# Patient Record
Sex: Female | Born: 1945 | Race: White | Hispanic: No | Marital: Married | Smoking: Never smoker
Health system: Southern US, Community
[De-identification: ages and names within clinical notes are randomized; demographics above are authoritative.]

## PROBLEM LIST (undated history)

## (undated) DIAGNOSIS — E785 Hyperlipidemia, unspecified: Secondary | ICD-10-CM

## (undated) DIAGNOSIS — M1990 Unspecified osteoarthritis, unspecified site: Secondary | ICD-10-CM

## (undated) HISTORY — DX: Unspecified osteoarthritis, unspecified site: M19.90

## (undated) HISTORY — PX: BIOPSY THYROID: PRO38

## (undated) HISTORY — DX: Hyperlipidemia, unspecified: E78.5

## (undated) HISTORY — PX: BREAST BIOPSY: SHX20

## (undated) HISTORY — PX: APPENDECTOMY: SHX54

## (undated) HISTORY — PX: CHOLECYSTECTOMY: SHX55

## (undated) HISTORY — PX: BREAST CYST ASPIRATION: SHX578

---

## 1984-05-09 HISTORY — PX: ABDOMINAL HYSTERECTOMY: SHX81

## 2008-05-09 HISTORY — PX: EYE SURGERY: SHX253

## 2011-04-20 DIAGNOSIS — M19039 Primary osteoarthritis, unspecified wrist: Secondary | ICD-10-CM | POA: Insufficient documentation

## 2014-05-09 HISTORY — PX: OTHER SURGICAL HISTORY: SHX169

## 2014-07-24 LAB — HM COLONOSCOPY

## 2015-05-20 DIAGNOSIS — R030 Elevated blood-pressure reading, without diagnosis of hypertension: Secondary | ICD-10-CM | POA: Diagnosis not present

## 2015-05-20 DIAGNOSIS — E785 Hyperlipidemia, unspecified: Secondary | ICD-10-CM | POA: Diagnosis not present

## 2015-05-22 DIAGNOSIS — M1711 Unilateral primary osteoarthritis, right knee: Secondary | ICD-10-CM | POA: Diagnosis not present

## 2015-05-22 DIAGNOSIS — M899 Disorder of bone, unspecified: Secondary | ICD-10-CM | POA: Diagnosis not present

## 2015-05-22 DIAGNOSIS — I10 Essential (primary) hypertension: Secondary | ICD-10-CM | POA: Diagnosis not present

## 2015-05-22 DIAGNOSIS — R030 Elevated blood-pressure reading, without diagnosis of hypertension: Secondary | ICD-10-CM | POA: Diagnosis not present

## 2015-05-22 DIAGNOSIS — E785 Hyperlipidemia, unspecified: Secondary | ICD-10-CM | POA: Diagnosis not present

## 2015-06-26 DIAGNOSIS — R252 Cramp and spasm: Secondary | ICD-10-CM | POA: Diagnosis not present

## 2015-08-13 DIAGNOSIS — R252 Cramp and spasm: Secondary | ICD-10-CM | POA: Diagnosis not present

## 2015-08-13 DIAGNOSIS — R112 Nausea with vomiting, unspecified: Secondary | ICD-10-CM | POA: Diagnosis not present

## 2015-08-13 DIAGNOSIS — E860 Dehydration: Secondary | ICD-10-CM | POA: Diagnosis not present

## 2015-08-13 DIAGNOSIS — K529 Noninfective gastroenteritis and colitis, unspecified: Secondary | ICD-10-CM | POA: Diagnosis not present

## 2015-09-14 DIAGNOSIS — K432 Incisional hernia without obstruction or gangrene: Secondary | ICD-10-CM | POA: Diagnosis not present

## 2015-09-14 DIAGNOSIS — Z9889 Other specified postprocedural states: Secondary | ICD-10-CM | POA: Diagnosis not present

## 2015-10-26 DIAGNOSIS — N390 Urinary tract infection, site not specified: Secondary | ICD-10-CM | POA: Diagnosis not present

## 2015-10-26 DIAGNOSIS — R300 Dysuria: Secondary | ICD-10-CM | POA: Diagnosis not present

## 2015-11-03 DIAGNOSIS — E785 Hyperlipidemia, unspecified: Secondary | ICD-10-CM | POA: Diagnosis not present

## 2015-11-03 DIAGNOSIS — R030 Elevated blood-pressure reading, without diagnosis of hypertension: Secondary | ICD-10-CM | POA: Diagnosis not present

## 2015-11-07 HISTORY — PX: HERNIA REPAIR: SHX51

## 2015-11-13 DIAGNOSIS — R252 Cramp and spasm: Secondary | ICD-10-CM | POA: Diagnosis not present

## 2015-11-13 DIAGNOSIS — E042 Nontoxic multinodular goiter: Secondary | ICD-10-CM | POA: Diagnosis not present

## 2015-11-13 DIAGNOSIS — Z01811 Encounter for preprocedural respiratory examination: Secondary | ICD-10-CM | POA: Diagnosis not present

## 2015-11-13 DIAGNOSIS — R030 Elevated blood-pressure reading, without diagnosis of hypertension: Secondary | ICD-10-CM | POA: Diagnosis not present

## 2015-11-13 DIAGNOSIS — K432 Incisional hernia without obstruction or gangrene: Secondary | ICD-10-CM | POA: Diagnosis not present

## 2015-11-13 DIAGNOSIS — M899 Disorder of bone, unspecified: Secondary | ICD-10-CM | POA: Diagnosis not present

## 2015-11-13 DIAGNOSIS — E785 Hyperlipidemia, unspecified: Secondary | ICD-10-CM | POA: Diagnosis not present

## 2015-11-13 DIAGNOSIS — M8580 Other specified disorders of bone density and structure, unspecified site: Secondary | ICD-10-CM | POA: Diagnosis not present

## 2015-11-13 DIAGNOSIS — Z01812 Encounter for preprocedural laboratory examination: Secondary | ICD-10-CM | POA: Diagnosis not present

## 2015-12-14 DIAGNOSIS — Z01812 Encounter for preprocedural laboratory examination: Secondary | ICD-10-CM | POA: Diagnosis not present

## 2015-12-14 DIAGNOSIS — Z8719 Personal history of other diseases of the digestive system: Secondary | ICD-10-CM | POA: Diagnosis not present

## 2015-12-14 DIAGNOSIS — Z9889 Other specified postprocedural states: Secondary | ICD-10-CM | POA: Diagnosis not present

## 2015-12-14 DIAGNOSIS — K432 Incisional hernia without obstruction or gangrene: Secondary | ICD-10-CM | POA: Diagnosis not present

## 2015-12-17 DIAGNOSIS — K432 Incisional hernia without obstruction or gangrene: Secondary | ICD-10-CM | POA: Diagnosis not present

## 2015-12-17 DIAGNOSIS — K5792 Diverticulitis of intestine, part unspecified, without perforation or abscess without bleeding: Secondary | ICD-10-CM | POA: Diagnosis not present

## 2015-12-17 DIAGNOSIS — Z5331 Laparoscopic surgical procedure converted to open procedure: Secondary | ICD-10-CM | POA: Diagnosis not present

## 2015-12-17 DIAGNOSIS — E785 Hyperlipidemia, unspecified: Secondary | ICD-10-CM | POA: Diagnosis not present

## 2015-12-18 DIAGNOSIS — E785 Hyperlipidemia, unspecified: Secondary | ICD-10-CM | POA: Diagnosis present

## 2015-12-18 DIAGNOSIS — K219 Gastro-esophageal reflux disease without esophagitis: Secondary | ICD-10-CM | POA: Diagnosis present

## 2015-12-18 DIAGNOSIS — K432 Incisional hernia without obstruction or gangrene: Secondary | ICD-10-CM | POA: Diagnosis present

## 2015-12-18 DIAGNOSIS — Z5331 Laparoscopic surgical procedure converted to open procedure: Secondary | ICD-10-CM | POA: Diagnosis not present

## 2015-12-18 DIAGNOSIS — M8580 Other specified disorders of bone density and structure, unspecified site: Secondary | ICD-10-CM | POA: Diagnosis present

## 2015-12-18 DIAGNOSIS — M1990 Unspecified osteoarthritis, unspecified site: Secondary | ICD-10-CM | POA: Diagnosis present

## 2015-12-18 DIAGNOSIS — K429 Umbilical hernia without obstruction or gangrene: Secondary | ICD-10-CM | POA: Diagnosis present

## 2015-12-18 DIAGNOSIS — K660 Peritoneal adhesions (postprocedural) (postinfection): Secondary | ICD-10-CM | POA: Diagnosis present

## 2015-12-18 DIAGNOSIS — E559 Vitamin D deficiency, unspecified: Secondary | ICD-10-CM | POA: Diagnosis present

## 2015-12-18 DIAGNOSIS — E042 Nontoxic multinodular goiter: Secondary | ICD-10-CM | POA: Diagnosis present

## 2015-12-18 DIAGNOSIS — G8918 Other acute postprocedural pain: Secondary | ICD-10-CM | POA: Diagnosis present

## 2016-02-01 DIAGNOSIS — H35373 Puckering of macula, bilateral: Secondary | ICD-10-CM | POA: Diagnosis not present

## 2016-02-01 DIAGNOSIS — D3132 Benign neoplasm of left choroid: Secondary | ICD-10-CM | POA: Diagnosis not present

## 2016-02-01 DIAGNOSIS — Z961 Presence of intraocular lens: Secondary | ICD-10-CM | POA: Diagnosis not present

## 2016-02-10 DIAGNOSIS — L821 Other seborrheic keratosis: Secondary | ICD-10-CM | POA: Diagnosis not present

## 2016-02-10 DIAGNOSIS — L309 Dermatitis, unspecified: Secondary | ICD-10-CM | POA: Diagnosis not present

## 2016-02-10 DIAGNOSIS — D1801 Hemangioma of skin and subcutaneous tissue: Secondary | ICD-10-CM | POA: Diagnosis not present

## 2016-02-18 DIAGNOSIS — Z23 Encounter for immunization: Secondary | ICD-10-CM | POA: Diagnosis not present

## 2016-03-20 DIAGNOSIS — J029 Acute pharyngitis, unspecified: Secondary | ICD-10-CM | POA: Diagnosis not present

## 2016-03-20 DIAGNOSIS — K121 Other forms of stomatitis: Secondary | ICD-10-CM | POA: Diagnosis not present

## 2016-03-25 DIAGNOSIS — K120 Recurrent oral aphthae: Secondary | ICD-10-CM | POA: Diagnosis not present

## 2016-04-13 DIAGNOSIS — E042 Nontoxic multinodular goiter: Secondary | ICD-10-CM | POA: Diagnosis not present

## 2016-04-13 DIAGNOSIS — Z6822 Body mass index (BMI) 22.0-22.9, adult: Secondary | ICD-10-CM | POA: Diagnosis not present

## 2016-05-11 ENCOUNTER — Ambulatory Visit: Payer: Self-pay | Admitting: Physician Assistant

## 2016-05-23 ENCOUNTER — Ambulatory Visit (INDEPENDENT_AMBULATORY_CARE_PROVIDER_SITE_OTHER): Payer: Medicare Other | Admitting: Physician Assistant

## 2016-05-23 ENCOUNTER — Encounter: Payer: Self-pay | Admitting: Physician Assistant

## 2016-05-23 VITALS — BP 150/82 | HR 72 | Temp 98.0°F | Resp 16 | Ht 66.0 in | Wt 142.0 lb

## 2016-05-23 DIAGNOSIS — K5790 Diverticulosis of intestine, part unspecified, without perforation or abscess without bleeding: Secondary | ICD-10-CM | POA: Insufficient documentation

## 2016-05-23 DIAGNOSIS — M1990 Unspecified osteoarthritis, unspecified site: Secondary | ICD-10-CM | POA: Insufficient documentation

## 2016-05-23 DIAGNOSIS — K219 Gastro-esophageal reflux disease without esophagitis: Secondary | ICD-10-CM | POA: Insufficient documentation

## 2016-05-23 DIAGNOSIS — Z7689 Persons encountering health services in other specified circumstances: Secondary | ICD-10-CM | POA: Diagnosis not present

## 2016-05-23 DIAGNOSIS — M1711 Unilateral primary osteoarthritis, right knee: Secondary | ICD-10-CM

## 2016-05-23 MED ORDER — MELOXICAM 7.5 MG PO TABS: 7.5000 mg | ORAL_TABLET | Freq: Every day | ORAL | 0 refills | Status: DC

## 2016-05-23 NOTE — Progress Notes (Signed)
Patient: Megan Cervantes Female    DOB: 02-22-1946   71 y.o.   MRN: [id number] Visit Date: 05/23/2016  Today's Provider: Mar Daring, PA-C   Chief Complaint  Patient presents with  . New Patient (Initial Visit)   Subjective:    HPI Patient here today to establish care. Patient was been seen at [facility] in [place], [place]. Patient reports she is UTD on vaccines. Patient was a Psychologist, occupational at [employer] in [place] and she was required to have all vaccines UTD.  Patient reports she is having right knee pain worsening in the last 6 months. This is her only complaint. She has been using NSAIDs, heat, and ice. She has had imaging done in [place], [place] but I do not have these records currently. She has completed a medical release form.     Allergies  Allergen Reactions  . Penicillin G Rash     Current Outpatient Prescriptions:  .  Cholecalciferol (VITAMIN D) 2000 units CAPS, Take 1 capsule by mouth daily., Disp: , Rfl:  .  esomeprazole (NEXIUM) 40 MG capsule, Take 40 mg by mouth daily., Disp: , Rfl: 6 .  Misc Natural Products (GLUCOSAMINE CHOND COMPLEX/MSM PO), Take 1 tablet by mouth daily., Disp: , Rfl:  .  Multiple Vitamins-Minerals (CENTRUM SILVER PO), Take 1 tablet by mouth daily., Disp: , Rfl:  .  vitamin B-12 (CYANOCOBALAMIN) 1000 MCG tablet, Take 1,000 mcg by mouth daily., Disp: , Rfl:   Review of Systems  Constitutional: Negative.   HENT: Negative.   Eyes: Negative.   Respiratory: Negative.   Cardiovascular: Negative.   Gastrointestinal: Negative.   Endocrine: Negative.   Genitourinary: Negative.   Musculoskeletal: Positive for joint swelling (right knee).  Skin: Negative.   Allergic/Immunologic: Negative.   Neurological: Negative.   Hematological: Negative.   Psychiatric/Behavioral: Negative.     Social History  Substance Use Topics  . Smoking status: Never Smoker  . Smokeless tobacco: Never Used  . Alcohol use No   Objective:   BP (!)  150/82 (BP Location: Left Arm, Patient Position: Sitting, Cuff Size: Large)   Pulse 72   Temp 98 F (36.7 C) (Oral)   Resp 16   Ht 5\' 6"  (1.676 m)   Wt 142 lb (64.4 kg)   SpO2 99%   BMI 22.92 kg/m   Physical Exam  Constitutional: She is oriented to person, place, and time. She appears well-developed and well-nourished. No distress.  HENT:  Head: Normocephalic and atraumatic.  Right Ear: External ear normal.  Left Ear: External ear normal.  Nose: Nose normal.  Mouth/Throat: Oropharynx is clear and moist. No oropharyngeal exudate.  Eyes: Conjunctivae and EOM are normal. Pupils are equal, round, and reactive to light. Right eye exhibits no discharge. Left eye exhibits no discharge. No scleral icterus.  Neck: Normal range of motion. Neck supple. No JVD present. No tracheal deviation present. No thyromegaly present.  Cardiovascular: Normal rate, regular rhythm, normal heart sounds and intact distal pulses.  Exam reveals no gallop and no friction rub.   No murmur heard. Pulmonary/Chest: Effort normal and breath sounds normal. No respiratory distress. She has no wheezes. She has no rales. She exhibits no tenderness.  Abdominal: Soft. Bowel sounds are normal. She exhibits no distension and no mass. There is no tenderness. There is no rebound and no guarding.  Musculoskeletal: Normal range of motion. She exhibits no edema.       Right knee: She exhibits normal range of motion,  no swelling, no effusion, no erythema, normal patellar mobility, no bony tenderness, normal meniscus and no MCL laxity. Tenderness found. Medial joint line tenderness noted.  Lymphadenopathy:    She has no cervical adenopathy.  Neurological: She is alert and oriented to person, place, and time.  Skin: Skin is warm and dry. No rash noted. She is not diaphoretic.  Psychiatric: She has a normal mood and affect. Her behavior is normal. Judgment and thought content normal.  Vitals reviewed.     Assessment & Plan:     1.  Establishing care with new doctor, encounter for Establishing from [place], [place] since she has relocated to this area.   2. Arthritis of right knee Will add meloxicam as below since patient has not used an NSAID regularly. She is to take with food. Offered imaging but patient declines. Wants to await me receiving her records to review. I will see her back in 4 weeks to see if she has had any improvement with her knee pain. If not, and I have her imaging, we may consider a cortisone injection at that time.  - meloxicam (MOBIC) 7.5 MG tablet; Take 1 tablet (7.5 mg total) by mouth daily.  Dispense: 30 tablet; Refill: 0       Mar Daring, PA-C  [facility]

## 2016-05-23 NOTE — Patient Instructions (Signed)
Arthritis Introduction Arthritis is a term that is commonly used to refer to joint pain or joint disease. There are more than 100 types of arthritis. What are the causes? The most common cause of this condition is wear and tear of a joint. Other causes include:  Gout.  Inflammation of a joint.  An infection of a joint.  Sprains and other injuries near the joint.  A drug reaction or allergic reaction. In some cases, the cause may not be known. What are the signs or symptoms? The main symptom of this condition is pain in the joint with movement. Other symptoms include:  Redness, swelling, or stiffness at a joint.  Warmth coming from the joint.  Fever.  Overall feeling of illness. How is this diagnosed? This condition may be diagnosed with a physical exam and tests, including:  Blood tests.  Urine tests.  Imaging tests, such as MRI, X-rays, or a CT scan. Sometimes, fluid is removed from a joint for testing. How is this treated? Treatment for this condition may involve:  Treatment of the cause, if it is known.  Rest.  Raising (elevating) the joint.  Applying cold or hot packs to the joint.  Medicines to improve symptoms and reduce inflammation.  Injections of a steroid such as cortisone into the joint to help reduce pain and inflammation. Depending on the cause of your arthritis, you may need to make lifestyle changes to reduce stress on your joint. These changes may include exercising more and losing weight. Follow these instructions at home: Medicines  Take over-the-counter and prescription medicines only as told by your health care provider.  Do not take aspirin to relieve pain if gout is suspected. Activity  Rest your joint if told by your health care provider. Rest is important when your disease is active and your joint feels painful, swollen, or stiff.  Avoid activities that make the pain worse. It is important to balance activity with rest.  Exercise  your joint regularly with range-of-motion exercises as told by your health care provider. Try doing low-impact exercise, such as:  Swimming.  Water aerobics.  Biking.  Walking. Joint Care   If your joint is swollen, keep it elevated if told by your health care provider.  If your joint feels stiff in the morning, try taking a warm shower.  If directed, apply heat to the joint. If you have diabetes, do not apply heat without permission from your health care provider.  Put a towel between the joint and the hot pack or heating pad.  Leave the heat on the area for 20-30 minutes.  If directed, apply ice to the joint:  Put ice in a plastic bag.  Place a towel between your skin and the bag.  Leave the ice on for 20 minutes, 2-3 times per day.  Keep all follow-up visits as told by your health care provider. This is important. Contact a health care provider if:  The pain gets worse.  You have a fever. Get help right away if:  You develop severe joint pain, swelling, or redness.  Many joints become painful and swollen.  You develop severe back pain.  You develop severe weakness in your leg.  You cannot control your bladder or bowels. This information is not intended to replace advice given to you by your health care provider. Make sure you discuss any questions you have with your health care provider. Document Released: 06/02/2004 Document Revised: 10/01/2015 Document Reviewed: 07/21/2014  2017 Elsevier  

## 2016-06-02 ENCOUNTER — Encounter: Payer: Self-pay | Admitting: Physician Assistant

## 2016-06-19 ENCOUNTER — Other Ambulatory Visit: Payer: Self-pay | Admitting: Physician Assistant

## 2016-06-19 DIAGNOSIS — M1711 Unilateral primary osteoarthritis, right knee: Secondary | ICD-10-CM

## 2016-06-20 ENCOUNTER — Encounter: Payer: Self-pay | Admitting: Physician Assistant

## 2016-06-20 ENCOUNTER — Ambulatory Visit (INDEPENDENT_AMBULATORY_CARE_PROVIDER_SITE_OTHER): Payer: Medicare Other | Admitting: Physician Assistant

## 2016-06-20 VITALS — BP 126/76 | HR 78 | Temp 98.0°F | Resp 16 | Wt 144.0 lb

## 2016-06-20 DIAGNOSIS — M1711 Unilateral primary osteoarthritis, right knee: Secondary | ICD-10-CM | POA: Diagnosis not present

## 2016-06-20 DIAGNOSIS — K219 Gastro-esophageal reflux disease without esophagitis: Secondary | ICD-10-CM

## 2016-06-20 MED ORDER — ESOMEPRAZOLE MAGNESIUM 40 MG PO CPDR: 40.0000 mg | DELAYED_RELEASE_CAPSULE | Freq: Every day | ORAL | 6 refills | Status: DC

## 2016-06-20 MED ORDER — RANITIDINE HCL 150 MG PO TABS: 150.0000 mg | ORAL_TABLET | Freq: Every day | ORAL | 5 refills | Status: DC

## 2016-06-20 NOTE — Patient Instructions (Signed)
Food Choices for Gastroesophageal Reflux Disease, Adult When you have gastroesophageal reflux disease (GERD), the foods you eat and your eating habits are very important. Choosing the right foods can help ease your discomfort. What guidelines do I need to follow?  Choose fruits, vegetables, whole grains, and low-fat dairy products.  Choose low-fat meat, fish, and poultry.  Limit fats such as oils, salad dressings, butter, nuts, and avocado.  Keep a food diary. This helps you identify foods that cause symptoms.  Avoid foods that cause symptoms. These may be different for everyone.  Eat small meals often instead of 3 large meals a day.  Eat your meals slowly, in a place where you are relaxed.  Limit fried foods.  Cook foods using methods other than frying.  Avoid drinking alcohol.  Avoid drinking large amounts of liquids with your meals.  Avoid bending over or lying down until 2-3 hours after eating. What foods are not recommended? These are some foods and drinks that may make your symptoms worse: Vegetables  Tomatoes. Tomato juice. Tomato and spaghetti sauce. Chili peppers. Onion and garlic. Horseradish. Fruits  Oranges, grapefruit, and lemon (fruit and juice). Meats  High-fat meats, fish, and poultry. This includes hot dogs, ribs, ham, sausage, salami, and bacon. Dairy  Whole milk and chocolate milk. Sour cream. Cream. Butter. Ice cream. Cream cheese. Drinks  Coffee and tea. Bubbly (carbonated) drinks or energy drinks. Condiments  Hot sauce. Barbecue sauce. Sweets/Desserts  Chocolate and cocoa. Donuts. Peppermint and spearmint. Fats and Oils  High-fat foods. This includes French fries and potato chips. Other  Vinegar. Strong spices. This includes black pepper, white pepper, red pepper, cayenne, curry powder, cloves, ginger, and chili powder. The items listed above may not be a complete list of foods and drinks to avoid. Contact your dietitian for more information.    This information is not intended to replace advice given to you by your health care provider. Make sure you discuss any questions you have with your health care provider. Document Released: 10/25/2011 Document Revised: 10/01/2015 Document Reviewed: 02/27/2013 Elsevier Interactive Patient Education  2017 Elsevier Inc.  

## 2016-06-20 NOTE — Progress Notes (Signed)
Patient: Megan Cervantes Female    DOB: [birth year]   71 y.o.   MRN: [id number] Visit Date: 06/20/2016  Today's Provider: Mar Daring, PA-C   Chief Complaint  Patient presents with  . Follow-up   Subjective:    HPI  Follow up for arthritis  The patient was last seen for this 4 weeks ago. Changes made at last visit include start meloxicam for right knee pain.  She reports excellent compliance with treatment. She feels that condition is Improved. She is not having side effects.   ------------------------------------------------------------------------------------  GERD: Paitent complains of reflux. This has been associated with bilious reflux and nausea.  She denies abdominal bloating, choking on food, cough and difficulty swallowing. Symptoms have been present for several years. She denies dysphagia.  She has not lost weight. She denies melena, hematochezia, hematemesis, and coffee ground emesis. Medical therapy in the past has included antacids and ranitidine. Patient is currently taking Nexium 40 mg every morning. When she has had flares in the past she will take nexium in the morning and one ranitidine at bedtime. This has worked well previously. Once she feels better she will go back to just nexium.     Allergies  Allergen Reactions  . Penicillin G Rash     Current Outpatient Prescriptions:  .  Cholecalciferol (VITAMIN D) 2000 units CAPS, Take 1 capsule by mouth daily., Disp: , Rfl:  .  esomeprazole (NEXIUM) 40 MG capsule, Take 40 mg by mouth daily., Disp: , Rfl: 6 .  meloxicam (MOBIC) 7.5 MG tablet, TAKE 1 TABLET (7.5 MG TOTAL) BY MOUTH DAILY., Disp: 30 tablet, Rfl: 5 .  Misc Natural Products (GLUCOSAMINE CHOND COMPLEX/MSM PO), Take 1 tablet by mouth daily., Disp: , Rfl:  .  Multiple Vitamins-Minerals (CENTRUM SILVER PO), Take 1 tablet by mouth daily., Disp: , Rfl:  .  vitamin B-12 (CYANOCOBALAMIN) 1000 MCG tablet, Take 1,000 mcg by mouth daily., Disp: ,  Rfl:   Review of Systems  Constitutional: Negative.   HENT: Positive for congestion.   Respiratory: Negative.   Cardiovascular: Negative.   Gastrointestinal: Positive for nausea. Negative for abdominal distention, abdominal pain, blood in stool, constipation, diarrhea and vomiting.  Musculoskeletal: Negative.   Neurological: Negative.     Social History  Substance Use Topics  . Smoking status: Never Smoker  . Smokeless tobacco: Never Used  . Alcohol use No   Objective:   BP 126/76 (BP Location: Left Arm, Patient Position: Sitting, Cuff Size: Large)   Pulse 78   Temp 98 F (36.7 C) (Oral)   Resp 16   Wt 144 lb (65.3 kg)   SpO2 99%   BMI 23.24 kg/m   Physical Exam  Constitutional: She appears well-developed and well-nourished. No distress.  Neck: Normal range of motion. Neck supple. No JVD present. No tracheal deviation present. No thyromegaly present.  Cardiovascular: Normal rate, regular rhythm and normal heart sounds.  Exam reveals no gallop and no friction rub.   No murmur heard. Pulmonary/Chest: Effort normal and breath sounds normal. No respiratory distress. She has no wheezes. She has no rales.  Abdominal: Soft. Bowel sounds are normal. She exhibits no distension and no mass. There is no tenderness. There is no rebound and no guarding.  Musculoskeletal: She exhibits no edema.  Lymphadenopathy:    She has no cervical adenopathy.  Skin: She is not diaphoretic.  Vitals reviewed.      Assessment & Plan:     1. Arthritis of  right knee Improved. Most likely flared from moving. May use meloxicam or tylenol prn. Call if symptoms worsen again.  2. GERD without esophagitis Worsening. Most likely secondary to NSAID (meloxicam) use. Advised patient to only use meloxicam prn now. May add ranitidine to nexium for symptom control. She is to call if no improvement and may change therapy. I will see her back in 2-4 weeks for AWV.  - esomeprazole (NEXIUM) 40 MG capsule; Take 1  capsule (40 mg total) by mouth daily.  Dispense: 30 capsule; Refill: 6 - ranitidine (ZANTAC) 150 MG tablet; Take 1 tablet (150 mg total) by mouth at bedtime.  Dispense: 30 tablet; Refill: [place], PA-C  [facility]

## 2016-06-20 NOTE — Telephone Encounter (Signed)
Last ov 05/23/16 Last filled 05/23/16 Please review. Thank you. sd

## 2016-06-29 DIAGNOSIS — D2312 Other benign neoplasm of skin of left eyelid, including canthus: Secondary | ICD-10-CM | POA: Diagnosis not present

## 2016-06-29 DIAGNOSIS — D485 Neoplasm of uncertain behavior of skin: Secondary | ICD-10-CM | POA: Diagnosis not present

## 2016-07-18 ENCOUNTER — Ambulatory Visit: Payer: Medicare Other | Admitting: Physician Assistant

## 2016-07-19 ENCOUNTER — Ambulatory Visit (INDEPENDENT_AMBULATORY_CARE_PROVIDER_SITE_OTHER): Payer: Medicare Other | Admitting: Physician Assistant

## 2016-07-19 ENCOUNTER — Encounter: Payer: Self-pay | Admitting: Physician Assistant

## 2016-07-19 VITALS — BP 120/80 | HR 60 | Temp 97.5°F | Resp 16 | Ht 66.0 in | Wt 145.4 lb

## 2016-07-19 DIAGNOSIS — Z0000 Encounter for general adult medical examination without abnormal findings: Secondary | ICD-10-CM

## 2016-07-19 DIAGNOSIS — Z1159 Encounter for screening for other viral diseases: Secondary | ICD-10-CM | POA: Diagnosis not present

## 2016-07-19 DIAGNOSIS — K219 Gastro-esophageal reflux disease without esophagitis: Secondary | ICD-10-CM | POA: Diagnosis not present

## 2016-07-19 DIAGNOSIS — Z13220 Encounter for screening for lipoid disorders: Secondary | ICD-10-CM

## 2016-07-19 DIAGNOSIS — Z136 Encounter for screening for cardiovascular disorders: Secondary | ICD-10-CM

## 2016-07-19 DIAGNOSIS — K5730 Diverticulosis of large intestine without perforation or abscess without bleeding: Secondary | ICD-10-CM | POA: Diagnosis not present

## 2016-07-19 NOTE — Patient Instructions (Signed)
Health Maintenance for Postmenopausal Women Menopause is a normal process in which your reproductive ability comes to an end. This process happens gradually over a span of months to years, usually between the ages of 33 and 38. Menopause is complete when you have missed 12 consecutive menstrual periods. It is important to talk with your health care provider about some of the most common conditions that affect postmenopausal women, such as heart disease, cancer, and bone loss (osteoporosis). Adopting a healthy lifestyle and getting preventive care can help to promote your health and wellness. Those actions can also lower your chances of developing some of these common conditions. What should I know about menopause? During menopause, you may experience a number of symptoms, such as:  Moderate-to-severe hot flashes.  Night sweats.  Decrease in sex drive.  Mood swings.  Headaches.  Tiredness.  Irritability.  Memory problems.  Insomnia. Choosing to treat or not to treat menopausal changes is an individual decision that you make with your health care provider. What should I know about hormone replacement therapy and supplements? Hormone therapy products are effective for treating symptoms that are associated with menopause, such as hot flashes and night sweats. Hormone replacement carries certain risks, especially as you become older. If you are thinking about using estrogen or estrogen with progestin treatments, discuss the benefits and risks with your health care provider. What should I know about heart disease and stroke? Heart disease, heart attack, and stroke become more likely as you age. This may be due, in part, to the hormonal changes that your body experiences during menopause. These can affect how your body processes dietary fats, triglycerides, and cholesterol. Heart attack and stroke are both medical emergencies. There are many things that you can do to help prevent heart disease  and stroke:  Have your blood pressure checked at least every 1-2 years. High blood pressure causes heart disease and increases the risk of stroke.  If you are 48-61 years old, ask your health care provider if you should take aspirin to prevent a heart attack or a stroke.  Do not use any tobacco products, including cigarettes, chewing tobacco, or electronic cigarettes. If you need help quitting, ask your health care provider.  It is important to eat a healthy diet and maintain a healthy weight.  Be sure to include plenty of vegetables, fruits, low-fat dairy products, and lean protein.  Avoid eating foods that are high in solid fats, added sugars, or salt (sodium).  Get regular exercise. This is one of the most important things that you can do for your health.  Try to exercise for at least 150 minutes each week. The type of exercise that you do should increase your heart rate and make you sweat. This is known as moderate-intensity exercise.  Try to do strengthening exercises at least twice each week. Do these in addition to the moderate-intensity exercise.  Know your numbers.Ask your health care provider to check your cholesterol and your blood glucose. Continue to have your blood tested as directed by your health care provider. What should I know about cancer screening? There are several types of cancer. Take the following steps to reduce your risk and to catch any cancer development as early as possible. Breast Cancer  Practice breast self-awareness.  This means understanding how your breasts normally appear and feel.  It also means doing regular breast self-exams. Let your health care provider know about any changes, no matter how small.  If you are 40 or older,  have a clinician do a breast exam (clinical breast exam or CBE) every year. Depending on your age, family history, and medical history, it may be recommended that you also have a yearly breast X-ray (mammogram).  If you  have a family history of breast cancer, talk with your health care provider about genetic screening.  If you are at high risk for breast cancer, talk with your health care provider about having an MRI and a mammogram every year.  Breast cancer (BRCA) gene test is recommended for women who have family members with BRCA-related cancers. Results of the assessment will determine the need for genetic counseling and BRCA1 and for BRCA2 testing. BRCA-related cancers include these types:  Breast. This occurs in males or females.  Ovarian.  Tubal. This may also be called fallopian tube cancer.  Cancer of the abdominal or pelvic lining (peritoneal cancer).  Prostate.  Pancreatic. Cervical, Uterine, and Ovarian Cancer  Your health care provider may recommend that you be screened regularly for cancer of the pelvic organs. These include your ovaries, uterus, and vagina. This screening involves a pelvic exam, which includes checking for microscopic changes to the surface of your cervix (Pap test).  For women ages 21-65, health care providers may recommend a pelvic exam and a Pap test every three years. For women ages 23-65, they may recommend the Pap test and pelvic exam, combined with testing for human papilloma virus (HPV), every five years. Some types of HPV increase your risk of cervical cancer. Testing for HPV may also be done on women of any age who have unclear Pap test results.  Other health care providers may not recommend any screening for nonpregnant women who are considered low risk for pelvic cancer and have no symptoms. Ask your health care provider if a screening pelvic exam is right for you.  If you have had past treatment for cervical cancer or a condition that could lead to cancer, you need Pap tests and screening for cancer for at least 20 years after your treatment. If Pap tests have been discontinued for you, your risk factors (such as having a new sexual partner) need to be reassessed  to determine if you should start having screenings again. Some women have medical problems that increase the chance of getting cervical cancer. In these cases, your health care provider may recommend that you have screening and Pap tests more often.  If you have a family history of uterine cancer or ovarian cancer, talk with your health care provider about genetic screening.  If you have vaginal bleeding after reaching menopause, tell your health care provider.  There are currently no reliable tests available to screen for ovarian cancer. Lung Cancer  Lung cancer screening is recommended for adults 99-83 years old who are at high risk for lung cancer because of a history of smoking. A yearly low-dose CT scan of the lungs is recommended if you:  Currently smoke.  Have a history of at least 30 pack-years of smoking and you currently smoke or have quit within the past 15 years. A pack-year is smoking an average of one pack of cigarettes per day for one year. Yearly screening should:  Continue until it has been 15 years since you quit.  Stop if you develop a health problem that would prevent you from having lung cancer treatment. Colorectal Cancer  This type of cancer can be detected and can often be prevented.  Routine colorectal cancer screening usually begins at age 72 and continues  through age 75.  If you have risk factors for colon cancer, your health care provider may recommend that you be screened at an earlier age.  If you have a family history of colorectal cancer, talk with your health care provider about genetic screening.  Your health care provider may also recommend using home test kits to check for hidden blood in your stool.  A small camera at the end of a tube can be used to examine your colon directly (sigmoidoscopy or colonoscopy). This is done to check for the earliest forms of colorectal cancer.  Direct examination of the colon should be repeated every 5-10 years until  age 75. However, if early forms of precancerous polyps or small growths are found or if you have a family history or genetic risk for colorectal cancer, you may need to be screened more often. Skin Cancer  Check your skin from head to toe regularly.  Monitor any moles. Be sure to tell your health care provider:  About any new moles or changes in moles, especially if there is a change in a mole's shape or color.  If you have a mole that is larger than the size of a pencil eraser.  If any of your family members has a history of skin cancer, especially at a young age, talk with your health care provider about genetic screening.  Always use sunscreen. Apply sunscreen liberally and repeatedly throughout the day.  Whenever you are outside, protect yourself by wearing long sleeves, pants, a wide-brimmed hat, and sunglasses. What should I know about osteoporosis? Osteoporosis is a condition in which bone destruction happens more quickly than new bone creation. After menopause, you may be at an increased risk for osteoporosis. To help prevent osteoporosis or the bone fractures that can happen because of osteoporosis, the following is recommended:  If you are 19-50 years old, get at least 1,000 mg of calcium and at least 600 mg of vitamin D per day.  If you are older than age 50 but younger than age 70, get at least 1,200 mg of calcium and at least 600 mg of vitamin D per day.  If you are older than age 70, get at least 1,200 mg of calcium and at least 800 mg of vitamin D per day. Smoking and excessive alcohol intake increase the risk of osteoporosis. Eat foods that are rich in calcium and vitamin D, and do weight-bearing exercises several times each week as directed by your health care provider. What should I know about how menopause affects my mental health? Depression may occur at any age, but it is more common as you become older. Common symptoms of depression include:  Low or sad  mood.  Changes in sleep patterns.  Changes in appetite or eating patterns.  Feeling an overall lack of motivation or enjoyment of activities that you previously enjoyed.  Frequent crying spells. Talk with your health care provider if you think that you are experiencing depression. What should I know about immunizations? It is important that you get and maintain your immunizations. These include:  Tetanus, diphtheria, and pertussis (Tdap) booster vaccine.  Influenza every year before the flu season begins.  Pneumonia vaccine.  Shingles vaccine. Your health care provider may also recommend other immunizations. This information is not intended to replace advice given to you by your health care provider. Make sure you discuss any questions you have with your health care provider. Document Released: 06/17/2005 Document Revised: 11/13/2015 Document Reviewed: 01/27/2015 Elsevier Interactive Patient   Education  2017 [employer]  

## 2016-07-19 NOTE — Progress Notes (Signed)
Patient: Megan Cervantes, Female    DOB: 12/23/1945, 71 y.o.   MRN: [id number] Visit Date: 07/19/2016  Today's Provider: Mar Daring, PA-C   Chief Complaint  Patient presents with  . Medicare Wellness   Subjective:    Annual wellness visit Megan Cervantes is a 71 y.o. female. She feels well. She reports exercising active with daily activites. She reports she is sleeping well.    04/18/16 Mammogram-normal  We will request records from Megan Cervantes. Patient filled out a release of information form today in the office.  -----------------------------------------------------------   Review of Systems  Constitutional: Negative.   HENT: Positive for drooling, mouth sores and sinus pressure.   Eyes: Negative.   Respiratory: Negative.   Cardiovascular: Negative.   Gastrointestinal: Negative.   Endocrine: Negative.   Genitourinary: Negative.   Musculoskeletal: Positive for arthralgias.  Skin: Negative.   Allergic/Immunologic: Negative.   Neurological: Negative.   Hematological: Negative.   Psychiatric/Behavioral: Negative.     Social History   Social History  . Marital status: Married    Spouse name: N/A  . Number of children: 0  . Years of education: N/A   Occupational History  . Not on file.   Social History Main Topics  . Smoking status: Never Smoker  . Smokeless tobacco: Never Used  . Alcohol use No  . Drug use: No  . Sexual activity: Not on file   Other Topics Concern  . Not on file   Social History Narrative  . No narrative on file    No past medical history on file.   Patient Active Problem List   Diagnosis Date Noted  . Arthritis 05/23/2016  . Diverticulosis 05/23/2016  . GERD (gastroesophageal reflux disease) 05/23/2016    Past Surgical History:  Procedure Laterality Date  . ABDOMINAL HYSTERECTOMY  1986   total  . APPENDECTOMY    . BIOPSY THYROID Bilateral   . BREAST BIOPSY Left    90's  . CHOLECYSTECTOMY  90's  .  diverticulisitis  2016   removed about a foot of colon  . EYE SURGERY Bilateral 2010   cataract  . HERNIA REPAIR  11/2015   mesh covers large portion of stomach    Her family history is not on file.      Current Outpatient Prescriptions:  .  Cholecalciferol (VITAMIN D) 2000 units CAPS, Take 1 capsule by mouth daily., Disp: , Rfl:  .  esomeprazole (NEXIUM) 40 MG capsule, Take 1 capsule (40 mg total) by mouth daily., Disp: 30 capsule, Rfl: 6 .  meloxicam (MOBIC) 7.5 MG tablet, TAKE 1 TABLET (7.5 MG TOTAL) BY MOUTH DAILY., Disp: 30 tablet, Rfl: 5 .  Misc Natural Products (GLUCOSAMINE CHOND COMPLEX/MSM PO), Take 1 tablet by mouth daily., Disp: , Rfl:  .  Multiple Vitamins-Minerals (CENTRUM SILVER PO), Take 1 tablet by mouth daily., Disp: , Rfl:  .  ranitidine (ZANTAC) 150 MG tablet, Take 1 tablet (150 mg total) by mouth at bedtime. (Patient taking differently: Take 150 mg by mouth as needed. ), Disp: 30 tablet, Rfl: 5 .  vitamin B-12 (CYANOCOBALAMIN) 1000 MCG tablet, Take 1,000 mcg by mouth daily., Disp: , Rfl:   Patient Care Team: Mar Daring, PA-C as PCP - General (Family Medicine)     Objective:   Vitals: BP 120/80 (BP Location: Left Arm, Patient Position: Sitting, Cuff Size: Normal)   Pulse 60   Temp 97.5 F (36.4 C)   Resp 16   Ht  5\' 6"  (1.676 m)   Wt 145 lb 6.4 oz (66 kg)   SpO2 (!) 16%   BMI 23.47 kg/m   Physical Exam  Constitutional: She is oriented to person, place, and time. She appears well-developed and well-nourished. No distress.  HENT:  Head: Normocephalic and atraumatic.  Right Ear: Hearing, tympanic membrane, external ear and ear canal normal.  Left Ear: Hearing, tympanic membrane, external ear and ear canal normal.  Nose: Nose normal.  Mouth/Throat: Uvula is midline, oropharynx is clear and moist and mucous membranes are normal. No oropharyngeal exudate.  Eyes: Conjunctivae and EOM are normal. Pupils are equal, round, and reactive to light. Right eye  exhibits no discharge. Left eye exhibits no discharge. No scleral icterus.  Neck: Normal range of motion. Neck supple. No JVD present. Carotid bruit is not present. No tracheal deviation present. No thyromegaly present.  Cardiovascular: Normal rate, regular rhythm, normal heart sounds and intact distal pulses.  Exam reveals no gallop and no friction rub.   No murmur heard. Pulmonary/Chest: Effort normal and breath sounds normal. No respiratory distress. She has no wheezes. She has no rales. She exhibits no tenderness. Right breast exhibits no inverted nipple, no mass, no nipple discharge, no skin change and no tenderness. Left breast exhibits no inverted nipple, no mass, no nipple discharge, no skin change and no tenderness. Breasts are symmetrical.  Abdominal: Soft. Bowel sounds are normal. She exhibits no distension and no mass. There is no tenderness. There is no rebound and no guarding.  Musculoskeletal: Normal range of motion. She exhibits no edema or tenderness.  Lymphadenopathy:    She has no cervical adenopathy.  Neurological: She is alert and oriented to person, place, and time.  Skin: Skin is warm and dry. No rash noted. She is not diaphoretic.  Psychiatric: She has a normal mood and affect. Her behavior is normal. Judgment and thought content normal.  Vitals reviewed.   Activities of Daily Living In your present state of health, do you have any difficulty performing the following activities: 07/19/2016  Hearing? N  Vision? N  Difficulty concentrating or making decisions? N  Walking or climbing stairs? N  Dressing or bathing? N  Doing errands, shopping? N    Fall Risk Assessment Fall Risk  07/19/2016  Falls in the past year? No     Depression Screen PHQ 2/9 Scores 07/19/2016  PHQ - 2 Score 0  PHQ- 9 Score 0    Cognitive Testing - 6-CIT  Correct? Score   What year is it? yes 0 0 or 4  What month is it? yes 0 0 or 3  Memorize:    Megan Cervantes,  43,  [place],       What time is it? (within 1 hour) yes 0 0 or 3  Count backwards from 20 yes 0 0, 2, or 4  Name the months of the year yes 0 0, 2, or 4  Repeat name & address above no 4 0, 2, 4, 6, 8, or 10       TOTAL SCORE  4/28   Interpretation:  Normal  Normal (0-7) Abnormal (8-28)    Audit-C Alcohol Use Screening  Question Answer Points  How often do you have alcoholic drink? never 0  On days you do drink alcohol, how many drinks do you typically consume? 0 0  How oftey will you drink 6 or more in a total? never 0  Total Score:  0   A  score of 3 or more in women, and 4 or more in men indicates increased risk for alcohol abuse, EXCEPT if all of the points are from question 1.     Assessment & Plan:     Annual Wellness Visit  Reviewed patient's Family Medical History Reviewed and updated list of patient's medical providers Assessment of cognitive impairment was done Assessed patient's functional ability Established a written schedule for health screening [place] Completed and Reviewed  Exercise Activities and Dietary recommendations Goals    None       There is no immunization history on file for this patient.  Health Maintenance  Topic Date Due  . Hepatitis C Screening  Sep 25, 1945  . TETANUS/TDAP  03/09/1966  . COLONOSCOPY  03/09/1997  . DEXA SCAN  03/09/2012  . PNA vac Low Risk Adult (1 of 2 - PCV13) 03/09/2012  . MAMMOGRAM  04/13/2018  . INFLUENZA VACCINE  Completed     Discussed health benefits of physical activity, and encouraged her to engage in regular exercise appropriate for her age and condition.    1. Medicare annual wellness visit, subsequent Normal physical exam today.   2. Gastroesophageal reflux disease, esophagitis presence not specified Stable. Will check labs as below and f/u pending results. - CBC w/Diff/Platelet - Comprehensive Metabolic Panel (CMET)  3. Diverticulosis of large intestine without hemorrhage Stable. Will  check labs as below and f/u pending results. - CBC w/Diff/Platelet - Comprehensive Metabolic Panel (CMET)  4. Encounter for lipid screening for cardiovascular disease Will check labs as below and f/u pending results. - Lipid Profile  5. Need for hepatitis C screening test - Hepatitis C Antibody  ------------------------------------------------------------------------------------------------------------    Mar Daring, PA-C  [facility]

## 2016-07-20 ENCOUNTER — Encounter: Payer: Self-pay | Admitting: Physician Assistant

## 2016-07-20 DIAGNOSIS — E7800 Pure hypercholesterolemia, unspecified: Secondary | ICD-10-CM | POA: Insufficient documentation

## 2016-07-20 LAB — COMPREHENSIVE METABOLIC PANEL
ALT: 18 IU/L (ref 0–32)
AST: 18 IU/L (ref 0–40)
Albumin/Globulin Ratio: 1.6 (ref 1.2–2.2)
Albumin: 4.4 g/dL (ref 3.6–4.8)
Alkaline Phosphatase: 85 IU/L (ref 39–117)
BILIRUBIN TOTAL: 0.3 mg/dL (ref 0.0–1.2)
BUN/Creatinine Ratio: 36 — ABNORMAL HIGH (ref 12–28)
BUN: 21 mg/dL (ref 8–27)
CHLORIDE: 99 mmol/L (ref 96–106)
CO2: 24 mmol/L (ref 18–29)
Calcium: 10.3 mg/dL (ref 8.7–10.3)
Creatinine, Ser: 0.58 mg/dL (ref 0.57–1.00)
GFR calc Af Amer: 109 mL/min/{1.73_m2} (ref >59)
GFR calc non Af Amer: 94 mL/min/{1.73_m2} (ref >59)
GLUCOSE: 100 mg/dL — AB (ref 65–99)
Globulin, Total: 2.7 g/dL (ref 1.5–4.5)
POTASSIUM: 4.9 mmol/L (ref 3.5–5.2)
Sodium: 140 mmol/L (ref 134–144)
TOTAL PROTEIN: 7.1 g/dL (ref 6.0–8.5)

## 2016-07-20 LAB — LIPID PANEL
CHOLESTEROL TOTAL: 217 mg/dL — AB (ref 100–199)
Chol/HDL Ratio: 3.6 ratio units (ref 0.0–4.4)
HDL: 60 mg/dL (ref >39)
LDL Calculated: 110 mg/dL — ABNORMAL HIGH (ref 0–99)
TRIGLYCERIDES: 234 mg/dL — AB (ref 0–149)
VLDL Cholesterol Cal: 47 mg/dL — ABNORMAL HIGH (ref 5–40)

## 2016-07-20 LAB — CBC WITH DIFFERENTIAL/PLATELET
BASOS: 0 %
Basophils Absolute: 0 10*3/uL (ref 0.0–0.2)
EOS (ABSOLUTE): 0.1 10*3/uL (ref 0.0–0.4)
EOS: 2 %
Hematocrit: 40.2 % (ref 34.0–46.6)
Hemoglobin: 12.9 g/dL (ref 11.1–15.9)
IMMATURE GRANS (ABS): 0 10*3/uL (ref 0.0–0.1)
IMMATURE GRANULOCYTES: 0 %
LYMPHS: 36 %
Lymphocytes Absolute: 1.8 10*3/uL (ref 0.7–3.1)
MCH: 31.7 pg (ref 26.6–33.0)
MCHC: 32.1 g/dL (ref 31.5–35.7)
MCV: 99 fL — AB (ref 79–97)
MONOCYTES: 6 %
Monocytes Absolute: 0.3 10*3/uL (ref 0.1–0.9)
NEUTROS PCT: 56 %
Neutrophils Absolute: 2.8 10*3/uL (ref 1.4–7.0)
PLATELETS: 262 10*3/uL (ref 150–379)
RBC: 4.07 x10E6/uL (ref 3.77–5.28)
RDW: 13.3 % (ref 12.3–15.4)
WBC: 5 10*3/uL (ref 3.4–10.8)

## 2016-07-20 LAB — HEPATITIS C ANTIBODY

## 2016-07-20 NOTE — Progress Notes (Signed)
Patient advised. She would prefer lifestyle modifications first. She scheduled a 6 month follow up appointment.

## 2016-08-09 DIAGNOSIS — D485 Neoplasm of uncertain behavior of skin: Secondary | ICD-10-CM | POA: Insufficient documentation

## 2016-09-13 ENCOUNTER — Encounter: Payer: Self-pay | Admitting: Physician Assistant

## 2016-09-13 LAB — HM MAMMOGRAPHY

## 2016-09-23 DIAGNOSIS — R030 Elevated blood-pressure reading, without diagnosis of hypertension: Secondary | ICD-10-CM | POA: Diagnosis not present

## 2016-09-23 DIAGNOSIS — K219 Gastro-esophageal reflux disease without esophagitis: Secondary | ICD-10-CM | POA: Diagnosis not present

## 2016-09-23 DIAGNOSIS — E7800 Pure hypercholesterolemia, unspecified: Secondary | ICD-10-CM | POA: Diagnosis not present

## 2016-09-23 DIAGNOSIS — Z01818 Encounter for other preprocedural examination: Secondary | ICD-10-CM | POA: Diagnosis not present

## 2016-09-23 DIAGNOSIS — Z6823 Body mass index (BMI) 23.0-23.9, adult: Secondary | ICD-10-CM | POA: Diagnosis not present

## 2016-09-23 DIAGNOSIS — M1990 Unspecified osteoarthritis, unspecified site: Secondary | ICD-10-CM | POA: Diagnosis not present

## 2016-10-05 ENCOUNTER — Telehealth: Payer: Self-pay | Admitting: Physician Assistant

## 2016-10-05 NOTE — Telephone Encounter (Signed)
Received records from [facility]. [place]. Received 21 pages from [place] 05-2015 to 04-2016.  ROI faxed to [facility]. [place]

## 2016-10-16 ENCOUNTER — Other Ambulatory Visit: Payer: Self-pay | Admitting: Physician Assistant

## 2016-10-16 DIAGNOSIS — K219 Gastro-esophageal reflux disease without esophagitis: Secondary | ICD-10-CM

## 2016-10-17 ENCOUNTER — Telehealth: Payer: Self-pay | Admitting: Physician Assistant

## 2016-10-17 NOTE — Telephone Encounter (Signed)
Has wellness 07/19/2016. GERD was stable at that time. Renaldo Fiddler, CMA

## 2016-10-17 NOTE — Telephone Encounter (Signed)
Error/MW °

## 2016-10-20 DIAGNOSIS — C44319 Basal cell carcinoma of skin of other parts of face: Secondary | ICD-10-CM | POA: Diagnosis not present

## 2016-10-20 DIAGNOSIS — D2339 Other benign neoplasm of skin of other parts of face: Secondary | ICD-10-CM | POA: Diagnosis not present

## 2016-10-20 DIAGNOSIS — L814 Other melanin hyperpigmentation: Secondary | ICD-10-CM | POA: Diagnosis not present

## 2016-10-20 DIAGNOSIS — L908 Other atrophic disorders of skin: Secondary | ICD-10-CM | POA: Diagnosis not present

## 2016-10-20 DIAGNOSIS — L578 Other skin changes due to chronic exposure to nonionizing radiation: Secondary | ICD-10-CM | POA: Diagnosis not present

## 2016-11-02 DIAGNOSIS — Z9889 Other specified postprocedural states: Secondary | ICD-10-CM | POA: Diagnosis not present

## 2016-11-02 DIAGNOSIS — C44119 Basal cell carcinoma of skin of left eyelid, including canthus: Secondary | ICD-10-CM | POA: Diagnosis not present

## 2016-11-02 DIAGNOSIS — Z85828 Personal history of other malignant neoplasm of skin: Secondary | ICD-10-CM | POA: Diagnosis not present

## 2016-11-04 ENCOUNTER — Ambulatory Visit (INDEPENDENT_AMBULATORY_CARE_PROVIDER_SITE_OTHER): Payer: Medicare Other | Admitting: Physician Assistant

## 2016-11-04 ENCOUNTER — Encounter: Payer: Self-pay | Admitting: Physician Assistant

## 2016-11-04 VITALS — BP 128/82 | HR 70 | Temp 97.9°F | Resp 16 | Wt 142.2 lb

## 2016-11-04 DIAGNOSIS — N3000 Acute cystitis without hematuria: Secondary | ICD-10-CM | POA: Diagnosis not present

## 2016-11-04 DIAGNOSIS — R300 Dysuria: Secondary | ICD-10-CM

## 2016-11-04 LAB — POCT URINALYSIS DIPSTICK
Bilirubin, UA: NEGATIVE
Glucose, UA: NEGATIVE
Ketones, UA: NEGATIVE
NITRITE UA: NEGATIVE
PH UA: 6.5 (ref 5.0–8.0)
Protein, UA: NEGATIVE
Spec Grav, UA: 1.02 (ref 1.010–1.025)
UROBILINOGEN UA: 0.2 U/dL

## 2016-11-04 MED ORDER — SULFAMETHOXAZOLE-TRIMETHOPRIM 800-160 MG PO TABS: 1.0000 | ORAL_TABLET | Freq: Two times a day (BID) | ORAL | 0 refills | Status: DC

## 2016-11-04 NOTE — Patient Instructions (Signed)

## 2016-11-04 NOTE — Progress Notes (Signed)
Patient: Megan Cervantes Female    DOB: July 12, 1945   71 y.o.   MRN: [id number] Visit Date: 11/04/2016  Today's Provider: Mar Daring, PA-C   Chief Complaint  Patient presents with  . Urinary Tract Infection   Subjective:    Urinary Tract Infection   This is a new problem. The current episode started today (She reports she woke up urinating on hersefl this morning.). The problem occurs every urination. The problem has been unchanged. The quality of the pain is described as aching ("really painful"). The pain is at a severity of 4/10. The pain is mild. There has been no fever. She is not sexually active. There is no history of pyelonephritis. Associated symptoms include chills, frequency and urgency. Pertinent negatives include no discharge, flank pain, hematuria or nausea. Treatments tried: Took AZO OTC. Her past medical history is significant for recurrent UTIs.      Allergies  Allergen Reactions  . Penicillin G Rash     Current Outpatient Prescriptions:  .  Cholecalciferol (VITAMIN D) 2000 units CAPS, Take 1 capsule by mouth daily., Disp: , Rfl:  .  esomeprazole (NEXIUM) 40 MG capsule, TAKE ONE CAPSULE BY MOUTH DAILY, Disp: 90 capsule, Rfl: 1 .  meloxicam (MOBIC) 7.5 MG tablet, TAKE 1 TABLET (7.5 MG TOTAL) BY MOUTH DAILY., Disp: 30 tablet, Rfl: 5 .  Misc Natural Products (GLUCOSAMINE CHOND COMPLEX/MSM PO), Take 1 tablet by mouth daily., Disp: , Rfl:  .  Multiple Vitamins-Minerals (CENTRUM SILVER PO), Take 1 tablet by mouth daily., Disp: , Rfl:  .  ranitidine (ZANTAC) 150 MG tablet, Take 1 tablet (150 mg total) by mouth at bedtime. (Patient taking differently: Take 150 mg by mouth as needed. ), Disp: 30 tablet, Rfl: 5 .  vitamin B-12 (CYANOCOBALAMIN) 1000 MCG tablet, Take 1,000 mcg by mouth daily., Disp: , Rfl:   Review of Systems  Constitutional: Positive for chills. Negative for fatigue.  Respiratory: Negative for cough, chest tightness and shortness of breath.     Cardiovascular: Negative for chest pain, palpitations and leg swelling.  Gastrointestinal: Negative for abdominal pain and nausea.  Genitourinary: Positive for dysuria, frequency and urgency. Negative for flank pain and hematuria.  Musculoskeletal: Negative for back pain.    Social History  Substance Use Topics  . Smoking status: Never Smoker  . Smokeless tobacco: Never Used  . Alcohol use No   Objective:   BP 128/82 (BP Location: Left Arm, Patient Position: Sitting, Cuff Size: Normal)   Pulse 70   Temp 97.9 F (36.6 C) (Oral)   Resp 16   Wt 142 lb 3.2 oz (64.5 kg)   BMI 22.95 kg/m  Vitals:   11/04/16 1004  BP: 128/82  Pulse: 70  Resp: 16  Temp: 97.9 F (36.6 C)  TempSrc: Oral  Weight: 142 lb 3.2 oz (64.5 kg)     Physical Exam  Constitutional: She is oriented to person, place, and time. She appears well-developed and well-nourished. No distress.  Cardiovascular: Normal rate, regular rhythm and normal heart sounds.  Exam reveals no gallop and no friction rub.   No murmur heard. Pulmonary/Chest: Effort normal and breath sounds normal. No respiratory distress. She has no wheezes. She has no rales.  Abdominal: Soft. Normal appearance and bowel sounds are normal. She exhibits no distension and no mass. There is no hepatosplenomegaly. There is tenderness in the suprapubic area. There is no rebound, no guarding and no CVA tenderness.  Suprapubic pressure  Neurological: She  is alert and oriented to person, place, and time.  Skin: Skin is warm and dry. She is not diaphoretic.  Vitals reviewed.     Assessment & Plan:     1. Acute cystitis without hematuria Worsening symptoms. UA positive. Will treat empirically with Bactrim as below. She is using AZO OTC for bladder spasm and pain. Continue to push fluids. Urine sent for culture. Will follow up pending C&S results. She is to call if symptoms do not improve or if they worsen.  - sulfamethoxazole-trimethoprim (BACTRIM DS,SEPTRA  DS) 800-160 MG tablet; Take 1 tablet by mouth 2 (two) times daily.  Dispense: 20 tablet; Refill: 0  2. Dysuria UA positive but discolored due to AZO. Will send for culture. - POCT urinalysis dipstick - Urine Culture       Mar Daring, PA-C  [facility]

## 2016-11-06 LAB — URINE CULTURE: ORGANISM ID, BACTERIA: NO GROWTH

## 2016-11-07 ENCOUNTER — Telehealth: Payer: Self-pay

## 2016-11-07 NOTE — Telephone Encounter (Signed)
-----   Message from Mar Daring, PA-C sent at 11/07/2016  8:32 AM EDT ----- Urine culture was negative. See if symptoms are improving please. Thanks!

## 2016-11-07 NOTE — Telephone Encounter (Signed)
Patient advised as below.  

## 2016-11-23 DIAGNOSIS — L821 Other seborrheic keratosis: Secondary | ICD-10-CM | POA: Diagnosis not present

## 2016-11-23 DIAGNOSIS — Z872 Personal history of diseases of the skin and subcutaneous tissue: Secondary | ICD-10-CM | POA: Diagnosis not present

## 2016-11-23 DIAGNOSIS — Z09 Encounter for follow-up examination after completed treatment for conditions other than malignant neoplasm: Secondary | ICD-10-CM | POA: Diagnosis not present

## 2016-11-23 DIAGNOSIS — L298 Other pruritus: Secondary | ICD-10-CM | POA: Diagnosis not present

## 2016-11-23 DIAGNOSIS — L538 Other specified erythematous conditions: Secondary | ICD-10-CM | POA: Diagnosis not present

## 2016-11-23 DIAGNOSIS — L820 Inflamed seborrheic keratosis: Secondary | ICD-10-CM | POA: Diagnosis not present

## 2017-01-19 ENCOUNTER — Ambulatory Visit: Payer: Self-pay | Admitting: Physician Assistant

## 2017-01-31 ENCOUNTER — Encounter: Payer: Self-pay | Admitting: Physician Assistant

## 2017-01-31 ENCOUNTER — Ambulatory Visit (INDEPENDENT_AMBULATORY_CARE_PROVIDER_SITE_OTHER): Payer: Medicare Other | Admitting: Physician Assistant

## 2017-01-31 VITALS — BP 110/80 | HR 64 | Temp 98.0°F | Resp 16 | Ht 66.0 in | Wt 137.0 lb

## 2017-01-31 DIAGNOSIS — J301 Allergic rhinitis due to pollen: Secondary | ICD-10-CM | POA: Diagnosis not present

## 2017-01-31 DIAGNOSIS — E7800 Pure hypercholesterolemia, unspecified: Secondary | ICD-10-CM

## 2017-01-31 DIAGNOSIS — Z23 Encounter for immunization: Secondary | ICD-10-CM | POA: Diagnosis not present

## 2017-01-31 DIAGNOSIS — K219 Gastro-esophageal reflux disease without esophagitis: Secondary | ICD-10-CM | POA: Diagnosis not present

## 2017-01-31 LAB — LIPID PANEL
CHOL/HDL RATIO: 3 (calc) (ref <5.0)
Cholesterol: 176 mg/dL (ref <200)
HDL: 59 mg/dL (ref >50)
LDL Cholesterol (Calc): 94 mg/dL (calc)
NON-HDL CHOLESTEROL (CALC): 117 mg/dL (ref <130)
TRIGLYCERIDES: 125 mg/dL (ref <150)

## 2017-01-31 MED ORDER — LORATADINE 10 MG PO TABS: 10.0000 mg | ORAL_TABLET | Freq: Every day | ORAL | 11 refills | Status: DC

## 2017-01-31 NOTE — Patient Instructions (Signed)

## 2017-01-31 NOTE — Progress Notes (Signed)
Patient: Megan Cervantes Female    DOB: 1946/01/28   71 y.o.   MRN: [id number] Visit Date: 01/31/2017  Today's Provider: Mar Daring, PA-C   Chief Complaint  Patient presents with  . Follow-up  . Back Pain  . Sore Throat   Subjective:    HPI  GERD, Follow up:  The patient was last seen for GERD 6 months ago. Changes made since that visit include none.  She reports excellent compliance with treatment. She is not having side effects.   She IS experiencing none. She is NOT experiencing belching, choking on food, cough or heartburn  ------------------------------------------------------------------------ Patient C/O back pain from neck all the way down to hips. Patient reports that she takes Meloxicam but is afraid to take it every day. Patient reports that when she takes it for a week continues the pain improves.   Patient C/O of sore throat, raspy x's one month. Patient reports that symptoms have improved. Patient reports that she has been feeling like getting a fever. Patient reports that she checked her temperature and it was normal. Patient reports that last week she vomited two nights.      Allergies  Allergen Reactions  . Penicillin G Rash     Current Outpatient Prescriptions:  .  Cholecalciferol (VITAMIN D) 2000 units CAPS, Take 1 capsule by mouth daily., Disp: , Rfl:  .  esomeprazole (NEXIUM) 40 MG capsule, TAKE ONE CAPSULE BY MOUTH DAILY, Disp: 90 capsule, Rfl: 1 .  meloxicam (MOBIC) 7.5 MG tablet, TAKE 1 TABLET (7.5 MG TOTAL) BY MOUTH DAILY., Disp: 30 tablet, Rfl: 5 .  Misc Natural Products (GLUCOSAMINE CHOND COMPLEX/MSM PO), Take 1 tablet by mouth daily., Disp: , Rfl:  .  Multiple Vitamins-Minerals (CENTRUM SILVER PO), Take 1 tablet by mouth daily., Disp: , Rfl:  .  ranitidine (ZANTAC) 150 MG tablet, Take 1 tablet (150 mg total) by mouth at bedtime. (Patient taking differently: Take 150 mg by mouth as needed. ), Disp: 30 tablet, Rfl: 5 .  vitamin  B-12 (CYANOCOBALAMIN) 1000 MCG tablet, Take 1,000 mcg by mouth daily., Disp: , Rfl:   Review of Systems  Constitutional: Positive for fever.  HENT: Positive for postnasal drip and sore throat. Negative for congestion, ear discharge, ear pain, rhinorrhea, sinus pain, sinus pressure, sneezing, tinnitus and trouble swallowing.   Respiratory: Negative.   Cardiovascular: Negative.   Gastrointestinal: Positive for nausea and vomiting. Negative for abdominal distention and abdominal pain.       Last week  Neurological: Negative.     Social History  Substance Use Topics  . Smoking status: Never Smoker  . Smokeless tobacco: Never Used  . Alcohol use No   Objective:   BP 110/80 (BP Location: Left Arm, Patient Position: Sitting, Cuff Size: Normal)   Pulse 64   Temp 98 F (36.7 C) (Oral)   Resp 16   Ht 5\' 6"  (1.676 m)   Wt 137 lb (62.1 kg)   SpO2 99%   BMI 22.11 kg/m    Physical Exam  Constitutional: She appears well-developed and well-nourished. No distress.  HENT:  Head: Normocephalic and atraumatic.  Right Ear: Hearing, tympanic membrane, external ear and ear canal normal.  Left Ear: Hearing, tympanic membrane, external ear and ear canal normal.  Nose: Nose normal.  Mouth/Throat: Uvula is midline, oropharynx is clear and moist and mucous membranes are normal. No oropharyngeal exudate.  Eyes: Pupils are equal, round, and reactive to light. Conjunctivae are normal. Right  eye exhibits no discharge. Left eye exhibits no discharge. No scleral icterus.  Neck: Normal range of motion. Neck supple. No tracheal deviation present. No thyromegaly present.  Cardiovascular: Normal rate, regular rhythm and normal heart sounds.  Exam reveals no gallop and no friction rub.   No murmur heard. Pulmonary/Chest: Effort normal and breath sounds normal. No stridor. No respiratory distress. She has no wheezes. She has no rales.  Abdominal: Soft. Bowel sounds are normal. She exhibits no distension and no  mass. There is no tenderness. There is no rebound and no guarding.  Lymphadenopathy:    She has no cervical adenopathy.  Skin: Skin is warm and dry. She is not diaphoretic.  Vitals reviewed.      Assessment & Plan:     1. GERD without esophagitis Stable on Nexium.   2. Seasonal allergic rhinitis due to pollen Suspect sore throat, nausea, vomiting from last week is related to seasonal allergies (suspect ragweed) as patient reports she had something similar this time last year as well. Lasted for about a week or so then subsided. Did not recur until last week. She does report significant postnasal drainage occurring just before her throat became sore and the nausea and vomiting developed. Claritin given for prn use when she notices the drainage starting.  - loratadine (CLARITIN) 10 MG tablet; Take 1 tablet (10 mg total) by mouth daily.  Dispense: 30 tablet; Refill: 11  3. Hypercholesterolemia Will check labs as below and f/u pending results.  - Lipid panel  4. Influenza vaccine needed Patient declined at this time.       Mar Daring, PA-C  [facility]

## 2017-02-01 ENCOUNTER — Telehealth: Payer: Self-pay

## 2017-02-01 NOTE — Telephone Encounter (Signed)
Nothing in [facility]? Do you know who her previous provider was?  Thanks,  -Jessilyn Catino

## 2017-02-01 NOTE — Telephone Encounter (Signed)
Patient advised.  She states she was asked about a pneumonia vaccine at her OV yesterday. She thinks she had one when she was in [place]. She is requesting that the records from her last PCP be reviewed to see before getting another one. She is requesting a call back after reviewing.

## 2017-02-01 NOTE — Telephone Encounter (Signed)
-----   Message from Mar Daring, [place] sent at 02/01/2017  9:57 AM EDT ----- Cholesterol much improved with lifestyle changes and is WNL.

## 2017-02-01 NOTE — Telephone Encounter (Signed)
[facility] in [place], [place]. There is already a medical release form under media

## 2017-02-03 NOTE — Telephone Encounter (Signed)
Will request immunization record.

## 2017-02-07 DIAGNOSIS — Z961 Presence of intraocular lens: Secondary | ICD-10-CM | POA: Diagnosis not present

## 2017-02-15 DIAGNOSIS — Z23 Encounter for immunization: Secondary | ICD-10-CM | POA: Diagnosis not present

## 2017-03-09 ENCOUNTER — Encounter: Payer: Self-pay | Admitting: Physician Assistant

## 2017-03-09 ENCOUNTER — Ambulatory Visit (INDEPENDENT_AMBULATORY_CARE_PROVIDER_SITE_OTHER): Payer: Medicare Other | Admitting: Physician Assistant

## 2017-03-09 VITALS — BP 120/80 | HR 68 | Temp 98.1°F | Resp 16 | Wt 138.6 lb

## 2017-03-09 DIAGNOSIS — R05 Cough: Secondary | ICD-10-CM

## 2017-03-09 DIAGNOSIS — R059 Cough, unspecified: Secondary | ICD-10-CM

## 2017-03-09 DIAGNOSIS — J069 Acute upper respiratory infection, unspecified: Secondary | ICD-10-CM

## 2017-03-09 MED ORDER — BENZONATATE 200 MG PO CAPS
200.0000 mg | ORAL_CAPSULE | Freq: Two times a day (BID) | ORAL | 0 refills | Status: DC | PRN
Start: 2017-03-09 — End: 2017-08-15

## 2017-03-09 MED ORDER — AZITHROMYCIN 250 MG PO TABS: ORAL_TABLET | ORAL | 0 refills | Status: DC

## 2017-03-09 NOTE — Patient Instructions (Signed)
Upper Respiratory Infection, Adult Most upper respiratory infections (URIs) are a viral infection of the air passages leading to the lungs. A URI affects the nose, throat, and upper air passages. The most common type of URI is nasopharyngitis and is typically referred to as "the common cold." URIs run their course and usually go away on their own. Most of the time, a URI does not require medical attention, but sometimes a bacterial infection in the upper airways can follow a viral infection. This is called a secondary infection. Sinus and middle ear infections are common types of secondary upper respiratory infections. Bacterial pneumonia can also complicate a URI. A URI can worsen asthma and chronic obstructive pulmonary disease (COPD). Sometimes, these complications can require emergency medical care and may be life threatening. What are the causes? Almost all URIs are caused by viruses. A virus is a type of germ and can spread from one person to another. What increases the risk? You may be at risk for a URI if:  You smoke.  You have chronic heart or lung disease.  You have a weakened defense (immune) system.  You are very young or very old.  You have nasal allergies or asthma.  You work in crowded or poorly ventilated areas.  You work in health care facilities or schools.  What are the signs or symptoms? Symptoms typically develop 2-3 days after you come in contact with a cold virus. Most viral URIs last 7-10 days. However, viral URIs from the influenza virus (flu virus) can last 14-18 days and are typically more severe. Symptoms may include:  Runny or stuffy (congested) nose.  Sneezing.  Cough.  Sore throat.  Headache.  Fatigue.  Fever.  Loss of appetite.  Pain in your forehead, behind your eyes, and over your cheekbones (sinus pain).  Muscle aches.  How is this diagnosed? Your health care provider may diagnose a URI by:  Physical exam.  Tests to check that your  symptoms are not due to another condition such as: ? Strep throat. ? Sinusitis. ? Pneumonia. ? Asthma.  How is this treated? A URI goes away on its own with time. It cannot be cured with medicines, but medicines may be prescribed or recommended to relieve symptoms. Medicines may help:  Reduce your fever.  Reduce your cough.  Relieve nasal congestion.  Follow these instructions at home:  Take medicines only as directed by your health care provider.  Gargle warm saltwater or take cough drops to comfort your throat as directed by your health care provider.  Use a warm mist humidifier or inhale steam from a shower to increase air moisture. This may make it easier to breathe.  Drink enough fluid to keep your urine clear or pale yellow.  Eat soups and other clear broths and maintain good nutrition.  Rest as needed.  Return to work when your temperature has returned to normal or as your health care provider advises. You may need to stay home longer to avoid infecting others. You can also use a face mask and careful hand washing to prevent spread of the virus.  Increase the usage of your inhaler if you have asthma.  Do not use any tobacco products, including cigarettes, chewing tobacco, or electronic cigarettes. If you need help quitting, ask your health care provider. How is this prevented? The best way to protect yourself from getting a cold is to practice good hygiene.  Avoid oral or hand contact with people with cold symptoms.  Wash your   hands often if contact occurs.  There is no clear evidence that vitamin C, vitamin E, echinacea, or exercise reduces the chance of developing a cold. However, it is always recommended to get plenty of rest, exercise, and practice good nutrition. Contact a health care provider if:  You are getting worse rather than better.  Your symptoms are not controlled by medicine.  You have chills.  You have worsening shortness of breath.  You have  brown or red mucus.  You have yellow or brown nasal discharge.  You have pain in your face, especially when you bend forward.  You have a fever.  You have swollen neck glands.  You have pain while swallowing.  You have white areas in the back of your throat. Get help right away if:  You have severe or persistent: ? Headache. ? Ear pain. ? Sinus pain. ? Chest pain.  You have chronic lung disease and any of the following: ? Wheezing. ? Prolonged cough. ? Coughing up blood. ? A change in your usual mucus.  You have a stiff neck.  You have changes in your: ? Vision. ? Hearing. ? Thinking. ? Mood. This information is not intended to replace advice given to you by your health care provider. Make sure you discuss any questions you have with your health care provider. Document Released: 10/19/2000 Document Revised: 12/27/2015 Document Reviewed: 07/31/2013 Elsevier Interactive Patient Education  2017 [employer]vier [employer]  

## 2017-03-09 NOTE — Progress Notes (Signed)
Patient: Megan Cervantes Female    DOB: [birth year]   71 y.o.   MRN: [id number] Visit Date: 03/09/2017  Today's Provider: Mar Daring, PA-C   Chief Complaint  Patient presents with  . URI   Subjective:    HPI Upper Respiratory Infection: Patient complains of symptoms of a URI. Symptoms include sore throat and nausea. Onset of symptoms was 4 days ago, gradually worsening since that time. She also c/o cough described as productive of clear sputum for the past 1 day .  She is drinking plenty of fluids. Evaluation to date: none. Treatment to date: antihistamines, cough suppressants and decongestants. Patient reports she did take OTC Tylenol Cold and head and reports good symptom control. Patient reports she has been around other family members with cold symptoms.     Allergies  Allergen Reactions  . Penicillin G Rash     Current Outpatient Prescriptions:  .  Cholecalciferol (VITAMIN D) 2000 units CAPS, Take 1 capsule by mouth daily., Disp: , Rfl:  .  esomeprazole (NEXIUM) 40 MG capsule, TAKE ONE CAPSULE BY MOUTH DAILY, Disp: 90 capsule, Rfl: 1 .  loratadine (CLARITIN) 10 MG tablet, Take 1 tablet (10 mg total) by mouth daily., Disp: 30 tablet, Rfl: 11 .  meloxicam (MOBIC) 7.5 MG tablet, TAKE 1 TABLET (7.5 MG TOTAL) BY MOUTH DAILY., Disp: 30 tablet, Rfl: 5 .  Misc Natural Products (GLUCOSAMINE CHOND COMPLEX/MSM PO), Take 1 tablet by mouth daily., Disp: , Rfl:  .  Multiple Vitamins-Minerals (CENTRUM SILVER PO), Take 1 tablet by mouth daily., Disp: , Rfl:  .  ranitidine (ZANTAC) 150 MG tablet, Take 1 tablet (150 mg total) by mouth at bedtime. (Patient taking differently: Take 150 mg by mouth as needed. ), Disp: 30 tablet, Rfl: 5 .  vitamin B-12 (CYANOCOBALAMIN) 1000 MCG tablet, Take 1,000 mcg by mouth daily., Disp: , Rfl:  .  FLUZONE HIGH-DOSE 0.5 ML injection, TO BE ADMINISTERED BY PHARMACIST FOR IMMUNIZATION, Disp: , Rfl: 0  Review of Systems  Constitutional: Positive for  fatigue. Negative for fever.  HENT: Positive for congestion, ear pain, postnasal drip, rhinorrhea, sinus pressure and sore throat. Negative for sinus pain.   Respiratory: Positive for cough. Negative for chest tightness and shortness of breath.   Cardiovascular: Negative for chest pain, palpitations and leg swelling.  Gastrointestinal: Positive for nausea and vomiting (once from drainage per patient).  Neurological: Negative.     Social History  Substance Use Topics  . Smoking status: Never Smoker  . Smokeless tobacco: Never Used  . Alcohol use No   Objective:   BP 120/80 (BP Location: Left Arm, Patient Position: Sitting, Cuff Size: Large)   Pulse 68   Temp 98.1 F (36.7 C) (Oral)   Resp 16   Wt 138 lb 9.6 oz (62.9 kg)   SpO2 96%   BMI 22.37 kg/m  Vitals:   03/09/17 1139  BP: 120/80  Pulse: 68  Resp: 16  Temp: 98.1 F (36.7 C)  TempSrc: Oral  SpO2: 96%  Weight: 138 lb 9.6 oz (62.9 kg)     Physical Exam  Constitutional: She appears well-developed and well-nourished. No distress.  HENT:  Head: Normocephalic and atraumatic.  Right Ear: Hearing, external ear and ear canal normal. Tympanic membrane is not perforated, not erythematous and not bulging. A middle ear effusion is present.  Left Ear: Hearing, external ear and ear canal normal. Tympanic membrane is not perforated, not retracted and not bulging. A middle ear  effusion is present.  Nose: Rhinorrhea present. No mucosal edema. Right sinus exhibits no maxillary sinus tenderness and no frontal sinus tenderness. Left sinus exhibits no maxillary sinus tenderness and no frontal sinus tenderness.  Mouth/Throat: Uvula is midline, oropharynx is clear and moist and mucous membranes are normal. No oropharyngeal exudate, posterior oropharyngeal edema or posterior oropharyngeal erythema.  Eyes: Pupils are equal, round, and reactive to light. Conjunctivae are normal. Right eye exhibits no discharge. Left eye exhibits no discharge. No  scleral icterus.  Neck: Normal range of motion. Neck supple. No tracheal deviation present. No thyromegaly present.  Cardiovascular: Normal rate, regular rhythm and normal heart sounds.  Exam reveals no gallop and no friction rub.   No murmur heard. Pulmonary/Chest: Effort normal and breath sounds normal. No stridor. No respiratory distress. She has no wheezes. She has no rales.  Lymphadenopathy:    She has no cervical adenopathy.  Skin: Skin is warm and dry. She is not diaphoretic.  Vitals reviewed.       Assessment & Plan:     1. Upper respiratory tract infection, unspecified type Worsening symptoms that have not responded to OTC medications. Will give Zpak as below. Continue allergy medications. Stay well hydrated and get plenty of rest. Call if no symptom improvement or if symptoms worsen. - azithromycin (ZITHROMAX) 250 MG tablet; Take 2 tabs PO on day 1, then 1 tab PO daily thereafter until completed  Dispense: 6 tablet; Refill: 0  2. Cough Tessalon perles given for daytime cough. Patient is using Nyquil at bedtime with symptom relief. - benzonatate (TESSALON) 200 MG capsule; Take 1 capsule (200 mg total) by mouth 2 (two) times daily as needed for cough.  Dispense: 20 capsule; Refill: 0       Mar Daring, PA-C  [facility]

## 2017-04-12 ENCOUNTER — Other Ambulatory Visit: Payer: Self-pay | Admitting: Physician Assistant

## 2017-04-12 DIAGNOSIS — K219 Gastro-esophageal reflux disease without esophagitis: Secondary | ICD-10-CM

## 2017-08-15 ENCOUNTER — Encounter: Payer: Self-pay | Admitting: Physician Assistant

## 2017-08-15 ENCOUNTER — Ambulatory Visit (INDEPENDENT_AMBULATORY_CARE_PROVIDER_SITE_OTHER): Payer: Medicare Other | Admitting: Physician Assistant

## 2017-08-15 ENCOUNTER — Ambulatory Visit (INDEPENDENT_AMBULATORY_CARE_PROVIDER_SITE_OTHER): Payer: Medicare Other

## 2017-08-15 VITALS — BP 150/88 | HR 72 | Temp 97.7°F | Ht 66.0 in | Wt 147.0 lb

## 2017-08-15 DIAGNOSIS — M1990 Unspecified osteoarthritis, unspecified site: Secondary | ICD-10-CM | POA: Diagnosis not present

## 2017-08-15 DIAGNOSIS — Z1231 Encounter for screening mammogram for malignant neoplasm of breast: Secondary | ICD-10-CM | POA: Diagnosis not present

## 2017-08-15 DIAGNOSIS — Z1239 Encounter for other screening for malignant neoplasm of breast: Secondary | ICD-10-CM

## 2017-08-15 DIAGNOSIS — K219 Gastro-esophageal reflux disease without esophagitis: Secondary | ICD-10-CM

## 2017-08-15 DIAGNOSIS — R7309 Other abnormal glucose: Secondary | ICD-10-CM

## 2017-08-15 DIAGNOSIS — E7800 Pure hypercholesterolemia, unspecified: Secondary | ICD-10-CM

## 2017-08-15 DIAGNOSIS — Z0000 Encounter for general adult medical examination without abnormal findings: Secondary | ICD-10-CM

## 2017-08-15 MED ORDER — IBUPROFEN 800 MG PO TABS: 800.0000 mg | ORAL_TABLET | Freq: Three times a day (TID) | ORAL | 5 refills | Status: DC | PRN

## 2017-08-15 NOTE — Patient Instructions (Addendum)
Megan Cervantes , Thank you for taking time to come for your Medicare Wellness Visit. I appreciate your ongoing commitment to your health goals. Please review the following plan we discussed and let me know if I can assist you in the future.   Screening recommendations/referrals: Colonoscopy: Up to date Mammogram: Up to date Bone Density: Pt declines today.  Recommended yearly ophthalmology/optometry visit for glaucoma screening and checkup Recommended yearly dental visit for hygiene and checkup  Vaccinations: Influenza vaccine: Up to date Pneumococcal vaccine: Up to date Tdap vaccine: Up to date Shingles vaccine: Pt declines today.     Advanced directives: Please bring a copy of your POA (Power of Attorney) and/or Living Will to your next appointment.   Conditions/risks identified: Recommend increasing water intake to 3-4 glasses a day.   Next appointment: 10:00 AM today with Megan Cervantes.   Preventive Care 40 Years and Older, Female Preventive care refers to lifestyle choices and visits with your health care provider that can promote health and wellness. What does preventive care include?  A yearly physical exam. This is also called an annual well check.  Dental exams once or twice a year.  Routine eye exams. Ask your health care provider how often you should have your eyes checked.  Personal lifestyle choices, including:  Daily care of your teeth and gums.  Regular physical activity.  Eating a healthy diet.  Avoiding tobacco and drug use.  Limiting alcohol use.  Practicing safe sex.  Taking low-dose aspirin every day.  Taking vitamin and mineral supplements as recommended by your health care provider. What happens during an annual well check? The services and screenings done by your health care provider during your annual well check will depend on your age, overall health, lifestyle risk factors, and family history of disease. Counseling  Your health care  provider may ask you questions about your:  Alcohol use.  Tobacco use.  Drug use.  Emotional well-being.  Home and relationship well-being.  Sexual activity.  Eating habits.  History of falls.  Memory and ability to understand (cognition).  Work and work Statistician.  Reproductive health. Screening  You may have the following tests or measurements:  Height, weight, and BMI.  Blood pressure.  Lipid and cholesterol levels. These may be checked every 5 years, or more frequently if you are over 25 years old.  Skin check.  Lung cancer screening. You may have this screening every year starting at age 20 if you have a 30-pack-year history of smoking and currently smoke or have quit within the past 15 years.  Fecal occult blood test (FOBT) of the stool. You may have this test every year starting at age 52.  Flexible sigmoidoscopy or colonoscopy. You may have a sigmoidoscopy every 5 years or a colonoscopy every 10 years starting at age 8.  Hepatitis C blood test.  Hepatitis B blood test.  Sexually transmitted disease (STD) testing.  Diabetes screening. This is done by checking your blood sugar (glucose) after you have not eaten for a while (fasting). You may have this done every 1-3 years.  Bone density scan. This is done to screen for osteoporosis. You may have this done starting at age 59.  Mammogram. This may be done every 1-2 years. Talk to your health care provider about how often you should have regular mammograms. Talk with your health care provider about your test results, treatment options, and if necessary, the need for more tests. Vaccines  Your health care provider may recommend  certain vaccines, such as:  Influenza vaccine. This is recommended every year.  Tetanus, diphtheria, and acellular pertussis (Tdap, Td) vaccine. You may need a Td booster every 10 years.  Zoster vaccine. You may need this after age 23.  Pneumococcal 13-valent conjugate (PCV13)  vaccine. One dose is recommended after age 70.  Pneumococcal polysaccharide (PPSV23) vaccine. One dose is recommended after age 30. Talk to your health care provider about which screenings and vaccines you need and how often you need them. This information is not intended to replace advice given to you by your health care provider. Make sure you discuss any questions you have with your health care provider. Document Released: 05/22/2015 Document Revised: 01/13/2016 Document Reviewed: 02/24/2015 Elsevier Interactive Patient Education  2017 [place] Prevention in the Home Falls can cause injuries. They can happen to people of all ages. There are many things you can do to make your home safe and to help prevent falls. What can I do on the outside of my home?  Regularly fix the edges of walkways and driveways and fix any cracks.  Remove anything that might make you trip as you walk through a door, such as a raised step or threshold.  Trim any bushes or trees on the path to your home.  Use bright outdoor lighting.  Clear any walking paths of anything that might make someone trip, such as rocks or tools.  Regularly check to see if handrails are loose or broken. Make sure that both sides of any steps have handrails.  Any raised decks and porches should have guardrails on the edges.  Have any leaves, snow, or ice cleared regularly.  Use sand or salt on walking paths during winter.  Clean up any spills in your garage right away. This includes oil or grease spills. What can I do in the bathroom?  Use night lights.  Install grab bars by the toilet and in the tub and shower. Do not use towel bars as grab bars.  Use non-skid mats or decals in the tub or shower.  If you need to sit down in the shower, use a plastic, non-slip stool.  Keep the floor dry. Clean up any water that spills on the floor as soon as it happens.  Remove soap buildup in the tub or shower  regularly.  Attach bath mats securely with double-sided non-slip rug tape.  Do not have throw rugs and other things on the floor that can make you trip. What can I do in the bedroom?  Use night lights.  Make sure that you have a light by your bed that is easy to reach.  Do not use any sheets or blankets that are too big for your bed. They should not hang down onto the floor.  Have a firm chair that has side arms. You can use this for support while you get dressed.  Do not have throw rugs and other things on the floor that can make you trip. What can I do in the kitchen?  Clean up any spills right away.  Avoid walking on wet floors.  Keep items that you use a lot in easy-to-reach places.  If you need to reach something above you, use a strong step stool that has a grab bar.  Keep electrical cords out of the way.  Do not use floor polish or wax that makes floors slippery. If you must use wax, use non-skid floor wax.  Do not have throw rugs and other  things on the floor that can make you trip. What can I do with my stairs?  Do not leave any items on the stairs.  Make sure that there are handrails on both sides of the stairs and use them. Fix handrails that are broken or loose. Make sure that handrails are as long as the stairways.  Check any carpeting to make sure that it is firmly attached to the stairs. Fix any carpet that is loose or worn.  Avoid having throw rugs at the top or bottom of the stairs. If you do have throw rugs, attach them to the floor with carpet tape.  Make sure that you have a light switch at the top of the stairs and the bottom of the stairs. If you do not have them, ask someone to add them for you. What else can I do to help prevent falls?  Wear shoes that:  Do not have high heels.  Have rubber bottoms.  Are comfortable and fit you well.  Are closed at the toe. Do not wear sandals.  If you use a stepladder:  Make sure that it is fully  opened. Do not climb a closed stepladder.  Make sure that both sides of the stepladder are locked into place.  Ask someone to hold it for you, if possible.  Clearly mark and make sure that you can see:  Any grab bars or handrails.  First and last steps.  Where the edge of each step is.  Use tools that help you move around (mobility aids) if they are needed. These include:  Canes.  Walkers.  Scooters.  Crutches.  Turn on the lights when you go into a dark area. Replace any light bulbs as soon as they burn out.  Set up your furniture so you have a clear path. Avoid moving your furniture around.  If any of your floors are uneven, fix them.  If there are any pets around you, be aware of where they are.  Review your medicines with your doctor. Some medicines can make you feel dizzy. This can increase your chance of falling. Ask your doctor what other things that you can do to help prevent falls. This information is not intended to replace advice given to you by your health care provider. Make sure you discuss any questions you have with your health care provider. Document Released: 02/19/2009 Document Revised: 10/01/2015 Document Reviewed: 05/30/2014 Elsevier Interactive Patient Education  2017 [employer].

## 2017-08-15 NOTE — Patient Instructions (Signed)
Health Maintenance for Postmenopausal Women Menopause is a normal process in which your reproductive ability comes to an end. This process happens gradually over a span of months to years, usually between the ages of 22 and 9. Menopause is complete when you have missed 12 consecutive menstrual periods. It is important to talk with your health care provider about some of the most common conditions that affect postmenopausal women, such as heart disease, cancer, and bone loss (osteoporosis). Adopting a healthy lifestyle and getting preventive care can help to promote your health and wellness. Those actions can also lower your chances of developing some of these common conditions. What should I know about menopause? During menopause, you may experience a number of symptoms, such as:  Moderate-to-severe hot flashes.  Night sweats.  Decrease in sex drive.  Mood swings.  Headaches.  Tiredness.  Irritability.  Memory problems.  Insomnia.  Choosing to treat or not to treat menopausal changes is an individual decision that you make with your health care provider. What should I know about hormone replacement therapy and supplements? Hormone therapy products are effective for treating symptoms that are associated with menopause, such as hot flashes and night sweats. Hormone replacement carries certain risks, especially as you become older. If you are thinking about using estrogen or estrogen with progestin treatments, discuss the benefits and risks with your health care provider. What should I know about heart disease and stroke? Heart disease, heart attack, and stroke become more likely as you age. This may be due, in part, to the hormonal changes that your body experiences during menopause. These can affect how your body processes dietary fats, triglycerides, and cholesterol. Heart attack and stroke are both medical emergencies. There are many things that you can do to help prevent heart disease  and stroke:  Have your blood pressure checked at least every 1-2 years. High blood pressure causes heart disease and increases the risk of stroke.  If you are 53-22 years old, ask your health care provider if you should take aspirin to prevent a heart attack or a stroke.  Do not use any tobacco products, including cigarettes, chewing tobacco, or electronic cigarettes. If you need help quitting, ask your health care provider.  It is important to eat a healthy diet and maintain a healthy weight. ? Be sure to include plenty of vegetables, fruits, low-fat dairy products, and lean protein. ? Avoid eating foods that are high in solid fats, added sugars, or salt (sodium).  Get regular exercise. This is one of the most important things that you can do for your health. ? Try to exercise for at least 150 minutes each week. The type of exercise that you do should increase your heart rate and make you sweat. This is known as moderate-intensity exercise. ? Try to do strengthening exercises at least twice each week. Do these in addition to the moderate-intensity exercise.  Know your numbers.Ask your health care provider to check your cholesterol and your blood glucose. Continue to have your blood tested as directed by your health care provider.  What should I know about cancer screening? There are several types of cancer. Take the following steps to reduce your risk and to catch any cancer development as early as possible. Breast Cancer  Practice breast self-awareness. ? This means understanding how your breasts normally appear and feel. ? It also means doing regular breast self-exams. Let your health care provider know about any changes, no matter how small.  If you are 40  or older, have a clinician do a breast exam (clinical breast exam or CBE) every year. Depending on your age, family history, and medical history, it may be recommended that you also have a yearly breast X-ray (mammogram).  If you  have a family history of breast cancer, talk with your health care provider about genetic screening.  If you are at high risk for breast cancer, talk with your health care provider about having an MRI and a mammogram every year.  Breast cancer (BRCA) gene test is recommended for women who have family members with BRCA-related cancers. Results of the assessment will determine the need for genetic counseling and BRCA1 and for BRCA2 testing. BRCA-related cancers include these types: ? Breast. This occurs in males or females. ? Ovarian. ? Tubal. This may also be called fallopian tube cancer. ? Cancer of the abdominal or pelvic lining (peritoneal cancer). ? Prostate. ? Pancreatic.  Cervical, Uterine, and Ovarian Cancer Your health care provider may recommend that you be screened regularly for cancer of the pelvic organs. These include your ovaries, uterus, and vagina. This screening involves a pelvic exam, which includes checking for microscopic changes to the surface of your cervix (Pap test).  For women ages 21-65, health care providers may recommend a pelvic exam and a Pap test every three years. For women ages 79-65, they may recommend the Pap test and pelvic exam, combined with testing for human papilloma virus (HPV), every five years. Some types of HPV increase your risk of cervical cancer. Testing for HPV may also be done on women of any age who have unclear Pap test results.  Other health care providers may not recommend any screening for nonpregnant women who are considered low risk for pelvic cancer and have no symptoms. Ask your health care provider if a screening pelvic exam is right for you.  If you have had past treatment for cervical cancer or a condition that could lead to cancer, you need Pap tests and screening for cancer for at least 20 years after your treatment. If Pap tests have been discontinued for you, your risk factors (such as having a new sexual partner) need to be  reassessed to determine if you should start having screenings again. Some women have medical problems that increase the chance of getting cervical cancer. In these cases, your health care provider may recommend that you have screening and Pap tests more often.  If you have a family history of uterine cancer or ovarian cancer, talk with your health care provider about genetic screening.  If you have vaginal bleeding after reaching menopause, tell your health care provider.  There are currently no reliable tests available to screen for ovarian cancer.  Lung Cancer Lung cancer screening is recommended for adults 69-62 years old who are at high risk for lung cancer because of a history of smoking. A yearly low-dose CT scan of the lungs is recommended if you:  Currently smoke.  Have a history of at least 30 pack-years of smoking and you currently smoke or have quit within the past 15 years. A pack-year is smoking an average of one pack of cigarettes per day for one year.  Yearly screening should:  Continue until it has been 15 years since you quit.  Stop if you develop a health problem that would prevent you from having lung cancer treatment.  Colorectal Cancer  This type of cancer can be detected and can often be prevented.  Routine colorectal cancer screening usually begins at  age 42 and continues through age 45.  If you have risk factors for colon cancer, your health care provider may recommend that you be screened at an earlier age.  If you have a family history of colorectal cancer, talk with your health care provider about genetic screening.  Your health care provider may also recommend using home test kits to check for hidden blood in your stool.  A small camera at the end of a tube can be used to examine your colon directly (sigmoidoscopy or colonoscopy). This is done to check for the earliest forms of colorectal cancer.  Direct examination of the colon should be repeated every  5-10 years until age 71. However, if early forms of precancerous polyps or small growths are found or if you have a family history or genetic risk for colorectal cancer, you may need to be screened more often.  Skin Cancer  Check your skin from head to toe regularly.  Monitor any moles. Be sure to tell your health care provider: ? About any new moles or changes in moles, especially if there is a change in a mole's shape or color. ? If you have a mole that is larger than the size of a pencil eraser.  If any of your family members has a history of skin cancer, especially at a young age, talk with your health care provider about genetic screening.  Always use sunscreen. Apply sunscreen liberally and repeatedly throughout the day.  Whenever you are outside, protect yourself by wearing long sleeves, pants, a wide-brimmed hat, and sunglasses.  What should I know about osteoporosis? Osteoporosis is a condition in which bone destruction happens more quickly than new bone creation. After menopause, you may be at an increased risk for osteoporosis. To help prevent osteoporosis or the bone fractures that can happen because of osteoporosis, the following is recommended:  If you are 46-71 years old, get at least 1,000 mg of calcium and at least 600 mg of vitamin D per day.  If you are older than age 55 but younger than age 65, get at least 1,200 mg of calcium and at least 600 mg of vitamin D per day.  If you are older than age 54, get at least 1,200 mg of calcium and at least 800 mg of vitamin D per day.  Smoking and excessive alcohol intake increase the risk of osteoporosis. Eat foods that are rich in calcium and vitamin D, and do weight-bearing exercises several times each week as directed by your health care provider. What should I know about how menopause affects my mental health? Depression may occur at any age, but it is more common as you become older. Common symptoms of depression  include:  Low or sad mood.  Changes in sleep patterns.  Changes in appetite or eating patterns.  Feeling an overall lack of motivation or enjoyment of activities that you previously enjoyed.  Frequent crying spells.  Talk with your health care provider if you think that you are experiencing depression. What should I know about immunizations? It is important that you get and maintain your immunizations. These include:  Tetanus, diphtheria, and pertussis (Tdap) booster vaccine.  Influenza every year before the flu season begins.  Pneumonia vaccine.  Shingles vaccine.  Your health care provider may also recommend other immunizations. This information is not intended to replace advice given to you by your health care provider. Make sure you discuss any questions you have with your health care provider. Document Released: 06/17/2005  Document Revised: 11/13/2015 Document Reviewed: 01/27/2015 Elsevier Interactive Patient Education  2018 [employer]  

## 2017-08-15 NOTE — Progress Notes (Signed)
Subjective:   Megan Cervantes is a 72 y.o. female who presents for Medicare Annual (Subsequent) preventive examination.  Review of Systems:  N/A Cardiac Risk Factors include: advanced age (>59men, >8 women);dyslipidemia     Objective:     Vitals: BP (!) 150/88 (BP Location: Left Arm)   Pulse 72   Temp 97.7 F (36.5 C) (Oral)   Ht 5\' 6"  (1.676 m)   Wt 147 lb (66.7 kg)   BMI 23.73 kg/m   Body mass index is 23.73 kg/m.  Advanced Directives 08/15/2017 11/04/2016  Does Patient Have a Medical Advance Directive? Yes Yes  Type of Advance Directive Living will Living will    Tobacco Social History   Tobacco Use  Smoking Status Never Smoker  Smokeless Tobacco Never Used     Counseling given: Not Answered   Clinical Intake:  Pre-visit preparation completed: Yes  Pain Score: 0-No pain     Nutritional Status: BMI of 19-24  Normal Nutritional Risks: None Diabetes: No  How often do you need to have someone help you when you read instructions, pamphlets, or other written materials from your doctor or pharmacy?: 1 - Never  Interpreter Needed?: No  Information entered by :: [facility], LPN  Past Medical History:  Diagnosis Date  . Hyperlipidemia    Past Surgical History:  Procedure Laterality Date  . ABDOMINAL HYSTERECTOMY  1986   total  . APPENDECTOMY    . BIOPSY THYROID Bilateral   . BREAST BIOPSY Left    90's  . CHOLECYSTECTOMY  90's  . diverticulisitis  2016   removed about a foot of colon  . EYE SURGERY Bilateral 2010   cataract  . HERNIA REPAIR  11/2015   mesh covers large portion of stomach   History reviewed. No pertinent family history. Social History   Socioeconomic History  . Marital status: Married    Spouse name: Not on file  . Number of children: 0  . Years of education: Not on file  . Highest education level: Some college, no degree  Occupational History  . 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 Financial resource strain: Not hard at all  . Food insecurity:    Worry: Never true    Inability: Never true  . Transportation needs:    Medical: No    Non-medical: No  Tobacco Use  . Smoking status: Never Smoker  . Smokeless tobacco: Never Used  Substance and Sexual Activity  . Alcohol use: No  . Drug use: No  . Sexual activity: Not on file  Lifestyle  . Physical activity:    Days per week: Not on file    Minutes per session: Not on file  . Stress: Not at all  Relationships  . Social connections:    Talks on phone: Not on file    Gets together: Not on file    Attends religious service: Not on file    Active member of club or organization: Not on file    Attends meetings of clubs or organizations: Not on file    Relationship status: Not on file  Other Topics Concern  . Not on file  Social History Narrative  . Not on file    Outpatient Encounter Medications as of 08/15/2017  Medication Sig  . Cholecalciferol (VITAMIN D) 2000 units CAPS Take 1 capsule by mouth daily.  Marland Kitchen esomeprazole (NEXIUM) 40 MG capsule TAKE ONE CAPSULE BY MOUTH  DAILY  . FLUZONE HIGH-DOSE 0.5 ML injection TO BE ADMINISTERED BY PHARMACIST FOR IMMUNIZATION  . meloxicam (MOBIC) 7.5 MG tablet TAKE 1 TABLET (7.5 MG TOTAL) BY MOUTH DAILY. (Patient taking differently: Take 7.5 mg by mouth daily. )  . Misc Natural Products (GLUCOSAMINE CHOND COMPLEX/MSM PO) Take 1 tablet by mouth daily.  . Multiple Vitamins-Minerals (CENTRUM SILVER PO) Take 1 tablet by mouth daily.  . pseudoephedrine-acetaminophen (TYLENOL SINUS) 30-500 MG TABS tablet Take 1 tablet by mouth every 4 (four) hours as needed.  . ranitidine (ZANTAC) 150 MG tablet Take 1 tablet (150 mg total) by mouth at bedtime. (Patient taking differently: Take 150 mg by mouth as needed. )  . vitamin B-12 (CYANOCOBALAMIN) 1000 MCG tablet Take 1,000 mcg by mouth daily.  . vitamin C (ASCORBIC ACID) 250 MG tablet Take 250 mg by mouth daily.  Marland Kitchen azithromycin (ZITHROMAX)  250 MG tablet Take 2 tabs PO on day 1, then 1 tab PO daily thereafter until completed (Patient not taking: Reported on 08/15/2017)  . benzonatate (TESSALON) 200 MG capsule Take 1 capsule (200 mg total) by mouth 2 (two) times daily as needed for cough. (Patient not taking: Reported on 08/15/2017)  . loratadine (CLARITIN) 10 MG tablet Take 1 tablet (10 mg total) by mouth daily. (Patient not taking: Reported on 08/15/2017)   No facility-administered encounter medications on file as of 08/15/2017.     Activities of Daily Living In your present state of health, do you have any difficulty performing the following activities: 08/15/2017  Hearing? N  Vision? N  Difficulty concentrating or making decisions? N  Walking or climbing stairs? N  Dressing or bathing? N  Doing errands, shopping? N  Preparing Food and eating ? N  Using the Toilet? N  In the past six months, have you accidently leaked urine? N  Do you have problems with loss of bowel control? N  Managing your Medications? N  Managing your Finances? N  Housekeeping or managing your Housekeeping? N  Some recent data might be hidden    Patient Care Team: Mar Daring, PA-C as PCP - General (Family Medicine) Estill Cotta, MD as Consulting Physician (Ophthalmology)    Assessment:   This is a routine wellness examination for Milady.  Exercise Activities and Dietary recommendations Current Exercise Habits: The patient does not participate in regular exercise at present(walks occasionally), Exercise limited by: Other - see comments(too busy)  Goals    . DIET - INCREASE WATER INTAKE     Recommend increasing water intake to 3-4 glasses a day.        Fall Risk Fall Risk  08/15/2017 07/19/2016  Falls in the past year? No No   Is the patient's home free of loose throw rugs in walkways, pet beds, electrical cords, etc?   yes      Grab bars in the bathroom? No       Handrails on the stairs?   no      Adequate lighting?   yes  Timed  Get Up and Go performed: N/A  Depression Screen PHQ 2/9 Scores 08/15/2017 08/15/2017 07/19/2016  PHQ - 2 Score 0 0 0  PHQ- 9 Score 0 - 0     Cognitive Function:      6CIT Screen 08/15/2017  What Year? 0 points  What month? 0 points  What time? 0 points  Count back from 20 0 points  Months in reverse 0 points  Repeat phrase 4 points  Total Score 4  Immunization History  Administered Date(s) Administered  . Hepatitis A 02/14/2013  . Influenza-Unspecified 02/15/2017  . Pneumococcal Conjugate-13 11/05/2013  . Pneumococcal Polysaccharide-23 05/07/2012  . Td 05/07/2012  . Tdap 05/22/2015  . Zoster 05/22/2015    Qualifies for Shingles Vaccine? Due for Shingles vaccine. Declined my offer to administer today. Education has been provided regarding the importance of this vaccine. Pt has been advised to call her insurance company to determine her out of pocket expense. Advised she may also receive this vaccine at her local pharmacy or Health Dept. Verbalized acceptance and understanding.  Screening Tests Health Maintenance  Topic Date Due  . DEXA SCAN  03/09/2012  . INFLUENZA VACCINE  12/07/2017  . MAMMOGRAM  09/14/2018  . COLONOSCOPY  07/23/2024  . TETANUS/TDAP  05/21/2025  . Hepatitis C Screening  Completed  . PNA vac Low Risk Adult  Completed    Cancer Screenings: Lung: Low Dose CT Chest recommended if Age 87-80 years, 30 pack-year currently smoking OR have quit w/in 15years. Patient does not qualify. Breast:  Up to date on Mammogram? Yes   Up to date of Bone Density/Dexa? No, pt declined referral today.  Colorectal: Up to date  Additional Screenings:  Hepatitis C Screening: Up to date     Plan:  I have personally reviewed and addressed the Medicare Annual Wellness questionnaire and have noted the following in the patient's chart:  A. Medical and social history B. Use of alcohol, tobacco or illicit drugs  C. Current medications and supplements D. Functional ability and  status E.  Nutritional status F.  Physical activity G. Advance directives H. List of other physicians I.  Hospitalizations, surgeries, and ER visits in previous 12 months J.  [place] such as hearing and vision if needed, cognitive and depression L. Referrals and appointments - none  In addition, I have reviewed and discussed with patient certain preventive protocols, quality metrics, and best practice recommendations. A written personalized care plan for preventive services as well as general preventive health recommendations were provided to patient.  See attached scanned questionnaire for additional information.   Signed,  Fabio Neighbors, LPN Nurse Health Advisor   Nurse Recommendations: Pt declined the DEXA referral today.

## 2017-08-15 NOTE — Progress Notes (Signed)
Patient: Megan Cervantes, Female    DOB: 18-Dec-1945, 72 y.o.   MRN: [id number] Visit Date: 08/15/2017  Today's Provider: Mar Daring, PA-C   Chief Complaint  Patient presents with  . Annual Wellness Exam   Subjective:  Patient saw Alyson Ingles @ 9:20 today.    Annual wellness visit Megan Cervantes is a 72 y.o. female. She feels well. She reports exercising none. She reports she is sleeping well.  Mammogram- 09/13/2016. BI-RADS 2 Colonoscopy- 07/24/2014. Diverticulitis.    Review of Systems  Constitutional: Negative.   HENT: Negative.   Eyes: Negative.   Respiratory: Negative.   Cardiovascular: Negative.   Gastrointestinal: Negative.   Endocrine: Negative.   Genitourinary: Negative.   Musculoskeletal: Negative.   Skin: Negative.   Allergic/Immunologic: Negative.   Neurological: Negative.   Hematological: Negative.   Psychiatric/Behavioral: Negative.     Social History   Socioeconomic History  . Marital status: Married    Spouse name: Not on file  . Number of children: 0  . Years of education: Not on file  . Highest education level: Some college, no degree  Occupational History  . 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 Financial resource strain: Not hard at all  . Food insecurity:    Worry: Never true    Inability: Never true  . Transportation needs:    Medical: No    Non-medical: No  Tobacco Use  . Smoking status: Never Smoker  . Smokeless tobacco: Never Used  Substance and Sexual Activity  . Alcohol use: No  . Drug use: No  . Sexual activity: Not on file  Lifestyle  . Physical activity:    Days per week: Not on file    Minutes per session: Not on file  . Stress: Not at all  Relationships  . Social connections:    Talks on phone: Not on file    Gets together: Not on file    Attends religious service: Not on file    Active member of club or organization: Not on file    Attends meetings of clubs  or organizations: Not on file    Relationship status: Not on file  . Intimate partner violence:    Fear of current or ex partner: Not on file    Emotionally abused: Not on file    Physically abused: Not on file    Forced sexual activity: Not on file  Other Topics Concern  . Not on file  Social History Narrative  . Not on file    Past Medical History:  Diagnosis Date  . Hyperlipidemia      Patient Active Problem List   Diagnosis Date Noted  . Hypercholesterolemia 07/20/2016  . Arthritis 05/23/2016  . Diverticulosis 05/23/2016  . GERD (gastroesophageal reflux disease) 05/23/2016    Past Surgical History:  Procedure Laterality Date  . ABDOMINAL HYSTERECTOMY  1986   total  . APPENDECTOMY    . BIOPSY THYROID Bilateral   . BREAST BIOPSY Left    90's  . CHOLECYSTECTOMY  90's  . diverticulisitis  2016   removed about a foot of colon  . EYE SURGERY Bilateral 2010   cataract  . HERNIA REPAIR  11/2015   mesh covers large portion of stomach    Her family history is not on file.      Current Outpatient Medications:  .  azithromycin (ZITHROMAX) 250 MG tablet, Take 2 tabs  PO on day 1, then 1 tab PO daily thereafter until completed (Patient not taking: Reported on 08/15/2017), Disp: 6 tablet, Rfl: 0 .  benzonatate (TESSALON) 200 MG capsule, Take 1 capsule (200 mg total) by mouth 2 (two) times daily as needed for cough. (Patient not taking: Reported on 08/15/2017), Disp: 20 capsule, Rfl: 0 .  Cholecalciferol (VITAMIN D) 2000 units CAPS, Take 1 capsule by mouth daily., Disp: , Rfl:  .  esomeprazole (NEXIUM) 40 MG capsule, TAKE ONE CAPSULE BY MOUTH DAILY, Disp: 90 capsule, Rfl: 1 .  FLUZONE HIGH-DOSE 0.5 ML injection, TO BE ADMINISTERED BY PHARMACIST FOR IMMUNIZATION, Disp: , Rfl: 0 .  loratadine (CLARITIN) 10 MG tablet, Take 1 tablet (10 mg total) by mouth daily. (Patient not taking: Reported on 08/15/2017), Disp: 30 tablet, Rfl: 11 .  meloxicam (MOBIC) 7.5 MG tablet, TAKE 1 TABLET (7.5  MG TOTAL) BY MOUTH DAILY. (Patient taking differently: Take 7.5 mg by mouth daily. ), Disp: 30 tablet, Rfl: 5 .  Misc Natural Products (GLUCOSAMINE CHOND COMPLEX/MSM PO), Take 1 tablet by mouth daily., Disp: , Rfl:  .  Multiple Vitamins-Minerals (CENTRUM SILVER PO), Take 1 tablet by mouth daily., Disp: , Rfl:  .  pseudoephedrine-acetaminophen (TYLENOL SINUS) 30-500 MG TABS tablet, Take 1 tablet by mouth every 4 (four) hours as needed., Disp: , Rfl:  .  ranitidine (ZANTAC) 150 MG tablet, Take 1 tablet (150 mg total) by mouth at bedtime. (Patient taking differently: Take 150 mg by mouth as needed. ), Disp: 30 tablet, Rfl: 5 .  vitamin B-12 (CYANOCOBALAMIN) 1000 MCG tablet, Take 1,000 mcg by mouth daily., Disp: , Rfl:  .  vitamin C (ASCORBIC ACID) 250 MG tablet, Take 250 mg by mouth daily., Disp: , Rfl:   Patient Care Team: Mar Daring, PA-C as PCP - General (Family Medicine) Dingeldein, Remo Lipps, MD as Consulting Physician (Ophthalmology)     Objective:   Vitals: BP (!) 150/88 (BP Location: Left Arm)   Pulse 72   Temp 97.7 F (36.5 C) (Oral)   Ht 5\' 6"  (1.676 m)   Wt 147 lb (66.7 kg)   BMI 23.73 kg/m   Body mass index is 23.73 kg/m.   Physical Exam: 72 yr old WFM in NAD Head: normal, atraumatic Neck: Supple, ROM normal, no carotid bruit Eyes: PERRL, EOM normal Ears: TM visualized and normal pearly gray Nose: Normal Mouth: Normal Heart: RRR, No M/G/R Lungs: CTA bialterally Abdomen: Soft, nontender, BS normal all 4 quadrants Extremities: no edema  Activities of Daily Living In your present state of health, do you have any difficulty performing the following activities: 08/15/2017  Hearing? N  Vision? N  Difficulty concentrating or making decisions? N  Walking or climbing stairs? N  Dressing or bathing? N  Doing errands, shopping? N  Preparing Food and eating ? N  Using the Toilet? N  In the past six months, have you accidently leaked urine? N  Do you have problems  with loss of bowel control? N  Managing your Medications? N  Managing your Finances? N  Housekeeping or managing your Housekeeping? N  Some recent data might be hidden    Fall Risk Assessment Fall Risk  08/15/2017 07/19/2016  Falls in the past year? No No     Depression Screen PHQ 2/9 Scores 08/15/2017 08/15/2017 07/19/2016  PHQ - 2 Score 0 0 0  PHQ- 9 Score 0 - 0    Cognitive Testing - 6-CIT 6CIT Screen 08/15/2017  What Year? 0 points  What month? 0 points  What time? 0 points  Count back from 20 0 points  Months in reverse 0 points  Repeat phrase 4 points  Total Score 4        Assessment & Plan:     Annual Wellness Visit  Reviewed patient's Family Medical History Reviewed and updated list of patient's medical providers Assessment of cognitive impairment was done Assessed patient's functional ability Established a written schedule for health screening [place] Completed and Reviewed  Exercise Activities and Dietary recommendations Goals    . DIET - INCREASE WATER INTAKE     Recommend increasing water intake to 3-4 glasses a day.        Immunization History  Administered Date(s) Administered  . Hepatitis A 02/14/2013  . Influenza-Unspecified 02/15/2017  . Pneumococcal Conjugate-13 11/05/2013  . Pneumococcal Polysaccharide-23 05/07/2012  . Td 05/07/2012  . Tdap 05/22/2015  . Zoster 05/22/2015    Health Maintenance  Topic Date Due  . DEXA SCAN  03/09/2012  . INFLUENZA VACCINE  12/07/2017  . MAMMOGRAM  09/14/2018  . COLONOSCOPY  07/23/2024  . TETANUS/TDAP  05/21/2025  . Hepatitis C Screening  Completed  . PNA vac Low Risk Adult  Completed     Discussed health benefits of physical activity, and encouraged her to engage in regular exercise appropriate for her age and condition.    1. Arthritis Stable. Diagnosis pulled for medication refill. Continue current medical treatment plan. - ibuprofen (ADVIL,MOTRIN) 800 MG tablet; Take 1  tablet (800 mg total) by mouth every 8 (eight) hours as needed.  Dispense: 30 tablet; Refill: 5  2. Hypercholesterolemia Stable. Diet controlled. Will check labs as below and f/u pending results. - CBC w/Diff/Platelet - Comprehensive Metabolic Panel (CMET) - Lipid Profile  3. Gastroesophageal reflux disease without esophagitis Stable. Continue ranitidine prn. - CBC w/Diff/Platelet - Comprehensive Metabolic Panel (CMET)  4. Elevated glucose Diet controlled. Will check labs as below and f/u pending results. - Comprehensive Metabolic Panel (CMET) - HgB A1c  5. Breast cancer screening There is no family history of breast cancer. She does perform regular self breast exams. Mammogram was ordered as below. Information for [facility] was given to patient so she may schedule her mammogram at her convenience. - MM Digital Screening; Future   Mar Daring, PA-C  [facility]

## 2017-08-16 ENCOUNTER — Telehealth: Payer: Self-pay

## 2017-08-16 LAB — LIPID PANEL
CHOLESTEROL TOTAL: 204 mg/dL — AB (ref 100–199)
Chol/HDL Ratio: 3.2 ratio (ref 0.0–4.4)
HDL: 63 mg/dL (ref >39)
LDL CALC: 107 mg/dL — AB (ref 0–99)
Triglycerides: 171 mg/dL — ABNORMAL HIGH (ref 0–149)
VLDL Cholesterol Cal: 34 mg/dL (ref 5–40)

## 2017-08-16 LAB — COMPREHENSIVE METABOLIC PANEL
A/G RATIO: 1.7 (ref 1.2–2.2)
ALBUMIN: 4.4 g/dL (ref 3.5–4.8)
ALK PHOS: 86 IU/L (ref 39–117)
ALT: 25 IU/L (ref 0–32)
AST: 21 IU/L (ref 0–40)
BUN/Creatinine Ratio: 27 (ref 12–28)
BUN: 15 mg/dL (ref 8–27)
Bilirubin Total: 0.3 mg/dL (ref 0.0–1.2)
CO2: 25 mmol/L (ref 20–29)
CREATININE: 0.55 mg/dL — AB (ref 0.57–1.00)
Calcium: 9.9 mg/dL (ref 8.7–10.3)
Chloride: 103 mmol/L (ref 96–106)
GFR calc Af Amer: 110 mL/min/{1.73_m2} (ref >59)
GFR calc non Af Amer: 95 mL/min/{1.73_m2} (ref >59)
GLOBULIN, TOTAL: 2.6 g/dL (ref 1.5–4.5)
Glucose: 84 mg/dL (ref 65–99)
POTASSIUM: 4.6 mmol/L (ref 3.5–5.2)
SODIUM: 142 mmol/L (ref 134–144)
Total Protein: 7 g/dL (ref 6.0–8.5)

## 2017-08-16 LAB — CBC WITH DIFFERENTIAL/PLATELET
BASOS: 0 %
Basophils Absolute: 0 10*3/uL (ref 0.0–0.2)
EOS (ABSOLUTE): 0.1 10*3/uL (ref 0.0–0.4)
EOS: 2 %
HEMATOCRIT: 39.6 % (ref 34.0–46.6)
HEMOGLOBIN: 12.9 g/dL (ref 11.1–15.9)
Immature Grans (Abs): 0 10*3/uL (ref 0.0–0.1)
Immature Granulocytes: 0 %
LYMPHS ABS: 1.5 10*3/uL (ref 0.7–3.1)
Lymphs: 26 %
MCH: 32.6 pg (ref 26.6–33.0)
MCHC: 32.6 g/dL (ref 31.5–35.7)
MCV: 100 fL — AB (ref 79–97)
MONOCYTES: 8 %
MONOS ABS: 0.4 10*3/uL (ref 0.1–0.9)
NEUTROS ABS: 3.6 10*3/uL (ref 1.4–7.0)
Neutrophils: 64 %
Platelets: 268 10*3/uL (ref 150–379)
RBC: 3.96 x10E6/uL (ref 3.77–5.28)
RDW: 13.6 % (ref 12.3–15.4)
WBC: 5.5 10*3/uL (ref 3.4–10.8)

## 2017-08-16 LAB — HEMOGLOBIN A1C
Est. average glucose Bld gHb Est-mCnc: 123 mg/dL
HEMOGLOBIN A1C: 5.9 % — AB (ref 4.8–5.6)

## 2017-08-16 NOTE — Telephone Encounter (Signed)
-----   Message from Mar Daring, PA-C sent at 08/16/2017 10:18 AM EDT ----- All labs are within normal limits and stable.  Thanks! -JB

## 2017-08-16 NOTE — Telephone Encounter (Signed)
[email or web address]

## 2017-08-17 NOTE — Telephone Encounter (Signed)
Patient advised as below.  

## 2017-09-13 ENCOUNTER — Encounter: Payer: Self-pay | Admitting: Physician Assistant

## 2017-09-13 ENCOUNTER — Ambulatory Visit (INDEPENDENT_AMBULATORY_CARE_PROVIDER_SITE_OTHER): Payer: Medicare Other | Admitting: Physician Assistant

## 2017-09-13 VITALS — BP 136/70 | HR 80 | Temp 98.2°F | Resp 16 | Wt 148.0 lb

## 2017-09-13 DIAGNOSIS — E042 Nontoxic multinodular goiter: Secondary | ICD-10-CM | POA: Diagnosis not present

## 2017-09-13 DIAGNOSIS — R300 Dysuria: Secondary | ICD-10-CM

## 2017-09-13 DIAGNOSIS — N3000 Acute cystitis without hematuria: Secondary | ICD-10-CM | POA: Diagnosis not present

## 2017-09-13 LAB — POCT URINALYSIS DIPSTICK
PH UA: 8 (ref 5.0–8.0)
Spec Grav, UA: >=1.03 — AB (ref 1.010–1.025)

## 2017-09-13 MED ORDER — CIPROFLOXACIN HCL 250 MG PO TABS: 250.0000 mg | ORAL_TABLET | Freq: Two times a day (BID) | ORAL | 0 refills | Status: DC

## 2017-09-13 NOTE — Progress Notes (Signed)
Patient: Megan Cervantes Female    DOB: [birth year]   72 y.o.   MRN: [id number] Visit Date: 09/13/2017  Today's Provider: Mar Daring, PA-C   Chief Complaint  Patient presents with  . Urinary Urgency   Subjective:    Urinary Tract Infection   This is a new problem. The current episode started today. The problem occurs every urination. The problem has been gradually worsening. The quality of the pain is described as burning. The patient is experiencing no pain. Associated symptoms include frequency, nausea and urgency. Pertinent negatives include no chills, discharge, flank pain or hematuria. She has tried increased fluids (she has also taken AZO this morning) for the symptoms. The treatment provided no relief. Her past medical history is significant for recurrent UTIs.       Allergies  Allergen Reactions  . Azithromycin Diarrhea and Nausea Only  . Penicillin G Rash     Current Outpatient Medications:  .  Cholecalciferol (VITAMIN D) 2000 units CAPS, Take 1 capsule by mouth daily., Disp: , Rfl:  .  esomeprazole (NEXIUM) 40 MG capsule, TAKE ONE CAPSULE BY MOUTH DAILY, Disp: 90 capsule, Rfl: 1 .  ibuprofen (ADVIL,MOTRIN) 800 MG tablet, Take 1 tablet (800 mg total) by mouth every 8 (eight) hours as needed., Disp: 30 tablet, Rfl: 5 .  Misc Natural Products (GLUCOSAMINE CHOND COMPLEX/MSM PO), Take 1 tablet by mouth daily., Disp: , Rfl:  .  Multiple Vitamins-Minerals (CENTRUM SILVER PO), Take 1 tablet by mouth daily., Disp: , Rfl:  .  pseudoephedrine-acetaminophen (TYLENOL SINUS) 30-500 MG TABS tablet, Take 1 tablet by mouth every 4 (four) hours as needed., Disp: , Rfl:  .  ranitidine (ZANTAC) 150 MG tablet, Take 1 tablet (150 mg total) by mouth at bedtime., Disp: 30 tablet, Rfl: 5 .  vitamin B-12 (CYANOCOBALAMIN) 1000 MCG tablet, Take 1,000 mcg by mouth daily., Disp: , Rfl:  .  vitamin C (ASCORBIC ACID) 250 MG tablet, Take 250 mg by mouth daily., Disp: , Rfl:  .  FLUZONE  HIGH-DOSE 0.5 ML injection, TO BE ADMINISTERED BY PHARMACIST FOR IMMUNIZATION, Disp: , Rfl: 0 .  loratadine (CLARITIN) 10 MG tablet, Take 1 tablet (10 mg total) by mouth daily. (Patient not taking: Reported on 08/15/2017), Disp: 30 tablet, Rfl: 11  Review of Systems  Constitutional: Negative for chills.  Respiratory: Negative.   Cardiovascular: Negative.   Gastrointestinal: Positive for nausea.  Genitourinary: Positive for frequency and urgency. Negative for flank pain and hematuria.    Social History   Tobacco Use  . Smoking status: Never Smoker  . Smokeless tobacco: Never Used  Substance Use Topics  . Alcohol use: No   Objective:   BP 136/70 (BP Location: Right Arm, Patient Position: Sitting, Cuff Size: Normal)   Pulse 80   Temp 98.2 F (36.8 C)   Resp 16   Wt 148 lb (67.1 kg)   SpO2 96%   BMI 23.89 kg/m  Vitals:   09/13/17 1554  BP: 136/70  Pulse: 80  Resp: 16  Temp: 98.2 F (36.8 C)  SpO2: 96%  Weight: 148 lb (67.1 kg)     Physical Exam  Constitutional: She is oriented to person, place, and time. She appears well-developed and well-nourished. No distress.  Cardiovascular: Normal rate, regular rhythm and normal heart sounds. Exam reveals no gallop and no friction rub.  No murmur heard. Pulmonary/Chest: Effort normal and breath sounds normal. No respiratory distress. She has no wheezes. She has no rales.  Abdominal: Soft. Normal appearance and bowel sounds are normal. She exhibits no distension and no mass. There is no hepatosplenomegaly. There is tenderness in the suprapubic area. There is no rebound, no guarding and no CVA tenderness.  Neurological: She is alert and oriented to person, place, and time.  Skin: Skin is warm and dry. She is not diaphoretic.       Assessment & Plan:     1. Acute cystitis without hematuria Worsening symptoms. UA positive. Will treat empirically with Cipro. Continue to push fluids. Urine sent for culture. Will follow up pending C&S  results. She is to call if symptoms do not improve or if they worsen.  - ciprofloxacin (CIPRO) 250 MG tablet; Take 1 tablet (250 mg total) by mouth 2 (two) times daily.  Dispense: 10 tablet; Refill: 0  2. Dysuria See above medical treatment plan. - Urine Culture - POCT urinalysis dipstick  3. Multinodular goiter H/O this. Last imaged in 04/2016 (report in media) in [place] prior to moving here. Will repeat imaging to check for stability. Has had multiple biopsies, all benign.  - US THYROID; Future       Mar Daring, PA-C  [facility]

## 2017-09-13 NOTE — Patient Instructions (Signed)

## 2017-09-14 ENCOUNTER — Encounter: Payer: Self-pay | Admitting: Physician Assistant

## 2017-09-15 ENCOUNTER — Telehealth: Payer: Self-pay

## 2017-09-15 LAB — URINE CULTURE

## 2017-09-15 NOTE — Telephone Encounter (Signed)
-----   Message from Mar Daring, PA-C sent at 09/15/2017  2:59 PM EDT ----- Urine culture was positive for E.coli. It is susceptible to Cipro. Continue until completed.

## 2017-09-15 NOTE — Telephone Encounter (Signed)
Patient advised as below.  

## 2017-09-19 ENCOUNTER — Ambulatory Visit: Payer: Medicare Other

## 2017-09-20 ENCOUNTER — Ambulatory Visit
Admission: RE | Admit: 2017-09-20 | Discharge: 2017-09-20 | Disposition: A | Discharge status: Home / Self Care | Payer: Medicare Other | Source: Ambulatory Visit | Attending: Physician Assistant | Admitting: Physician Assistant

## 2017-09-20 DIAGNOSIS — E042 Nontoxic multinodular goiter: Secondary | ICD-10-CM

## 2017-09-21 ENCOUNTER — Telehealth: Payer: Self-pay

## 2017-09-21 NOTE — Telephone Encounter (Signed)
-----   Message from Mar Daring, PA-C sent at 09/21/2017 10:06 AM EDT ----- Stable nodule.

## 2017-09-21 NOTE — Telephone Encounter (Signed)
Patient advised as below.  

## 2017-10-28 ENCOUNTER — Other Ambulatory Visit: Payer: Self-pay | Admitting: Physician Assistant

## 2017-10-28 DIAGNOSIS — K219 Gastro-esophageal reflux disease without esophagitis: Secondary | ICD-10-CM

## 2017-11-22 DIAGNOSIS — Z872 Personal history of diseases of the skin and subcutaneous tissue: Secondary | ICD-10-CM | POA: Diagnosis not present

## 2017-11-22 DIAGNOSIS — D2261 Melanocytic nevi of right upper limb, including shoulder: Secondary | ICD-10-CM | POA: Diagnosis not present

## 2017-11-22 DIAGNOSIS — D2272 Melanocytic nevi of left lower limb, including hip: Secondary | ICD-10-CM | POA: Diagnosis not present

## 2017-11-22 DIAGNOSIS — L821 Other seborrheic keratosis: Secondary | ICD-10-CM | POA: Diagnosis not present

## 2017-11-22 DIAGNOSIS — D2262 Melanocytic nevi of left upper limb, including shoulder: Secondary | ICD-10-CM | POA: Diagnosis not present

## 2017-11-22 DIAGNOSIS — D2271 Melanocytic nevi of right lower limb, including hip: Secondary | ICD-10-CM | POA: Diagnosis not present

## 2017-11-22 DIAGNOSIS — D225 Melanocytic nevi of trunk: Secondary | ICD-10-CM | POA: Diagnosis not present

## 2017-11-22 DIAGNOSIS — Z09 Encounter for follow-up examination after completed treatment for conditions other than malignant neoplasm: Secondary | ICD-10-CM | POA: Diagnosis not present

## 2017-12-26 ENCOUNTER — Encounter: Payer: Self-pay | Admitting: Family Medicine

## 2017-12-26 ENCOUNTER — Ambulatory Visit (INDEPENDENT_AMBULATORY_CARE_PROVIDER_SITE_OTHER): Payer: Medicare Other | Admitting: Family Medicine

## 2017-12-26 VITALS — BP 110/70 | HR 76 | Temp 97.9°F | Resp 16 | Wt 150.6 lb

## 2017-12-26 DIAGNOSIS — N3090 Cystitis, unspecified without hematuria: Secondary | ICD-10-CM | POA: Diagnosis not present

## 2017-12-26 LAB — POCT URINALYSIS DIPSTICK: Glucose, UA: NEGATIVE

## 2017-12-26 MED ORDER — CEPHALEXIN 500 MG PO CAPS: 500.0000 mg | ORAL_CAPSULE | Freq: Two times a day (BID) | ORAL | 0 refills | Status: DC

## 2017-12-26 NOTE — Patient Instructions (Signed)
We will call you with the urine culture results. Let us know if you can't tolerate this antibiotic.

## 2017-12-26 NOTE — Progress Notes (Signed)
  Subjective:     Patient ID: Megan Cervantes, female   DOB: 07/19/1945, 72 y.o.   MRN: [id number] Chief Complaint  Patient presents with  . Dysuria    Patient comes in office today with complaints of dysuria, frequency, urgency and lower back pain for less than 24hrs.    HPI No fever or chills. Has been taking AZO for her sx. Last urine culture with pansensitive E. Coli. Prior reaction to [facility] was in childhood: "rash with white bumps."  Review of Systems     Objective:   Physical Exam  Constitutional: She appears well-developed and well-nourished. No distress.  Genitourinary:  Genitourinary Comments: No cva tenderness       Assessment:    1. Cystitis: start cephalexin - Urine Culture - POCT urinalysis dipstick    Plan:    Further f/u pending urine culture result.

## 2017-12-28 ENCOUNTER — Telehealth: Payer: Self-pay

## 2017-12-28 LAB — URINE CULTURE

## 2017-12-28 NOTE — Telephone Encounter (Signed)
-----   Message from Carmon Ginsberg, [place] sent at 12/28/2017  7:27 AM EDT ----- No infection noted on urine culture. Did the antibiotic help? If still having synmptoms would f/u with Tawanna Sat.

## 2017-12-28 NOTE — Telephone Encounter (Signed)
Patient advised as directed below.  Thanks,  -Koron Godeaux 

## 2018-01-04 ENCOUNTER — Telehealth: Payer: Self-pay

## 2018-01-04 DIAGNOSIS — N3000 Acute cystitis without hematuria: Secondary | ICD-10-CM

## 2018-01-04 MED ORDER — CIPROFLOXACIN HCL 500 MG PO TABS: 500.0000 mg | ORAL_TABLET | Freq: Two times a day (BID) | ORAL | 0 refills | Status: DC

## 2018-01-04 NOTE — Telephone Encounter (Signed)
Sent in [place]. Call if no improvements.

## 2018-01-04 NOTE — Telephone Encounter (Signed)
Patient called stating that she was seen by Megan Cervantes for symptoms of pain during urination. She was treated with an antibiotic, but not infection was seen on the culture. Patient says she still has the burning pain when she urinates and urgency. Patient wanted to see you tomorrow, but you are out of the office. Patient wants to know if you will prescribe cipro for her symptoms.

## 2018-01-05 NOTE — Telephone Encounter (Signed)
Patient advised as directed below. 

## 2018-01-09 ENCOUNTER — Encounter: Payer: Self-pay | Admitting: Physician Assistant

## 2018-01-09 ENCOUNTER — Ambulatory Visit (INDEPENDENT_AMBULATORY_CARE_PROVIDER_SITE_OTHER): Payer: Medicare Other | Admitting: Physician Assistant

## 2018-01-09 VITALS — BP 138/82 | HR 80 | Temp 97.8°F | Resp 16 | Wt 152.6 lb

## 2018-01-09 DIAGNOSIS — K5900 Constipation, unspecified: Secondary | ICD-10-CM | POA: Diagnosis not present

## 2018-01-09 DIAGNOSIS — N390 Urinary tract infection, site not specified: Secondary | ICD-10-CM | POA: Diagnosis not present

## 2018-01-09 DIAGNOSIS — N3090 Cystitis, unspecified without hematuria: Secondary | ICD-10-CM | POA: Diagnosis not present

## 2018-01-09 DIAGNOSIS — M1990 Unspecified osteoarthritis, unspecified site: Secondary | ICD-10-CM | POA: Diagnosis not present

## 2018-01-09 LAB — POCT URINALYSIS DIPSTICK
Bilirubin, UA: NEGATIVE
Blood, UA: NEGATIVE
Glucose, UA: NEGATIVE
KETONES UA: NEGATIVE
Leukocytes, UA: NEGATIVE
NITRITE UA: NEGATIVE
PH UA: 6.5 (ref 5.0–8.0)
PROTEIN UA: NEGATIVE
Urobilinogen, UA: 0.2 E.U./dL

## 2018-01-09 MED ORDER — MELOXICAM 7.5 MG PO TABS: 7.5000 mg | ORAL_TABLET | Freq: Every day | ORAL | 0 refills | Status: DC

## 2018-01-09 MED ORDER — POLYETHYLENE GLYCOL 3350 17 GM/SCOOP PO POWD: 17.0000 g | Freq: Two times a day (BID) | ORAL | 1 refills | Status: DC | PRN

## 2018-01-09 MED ORDER — ESTROGENS, CONJUGATED 0.625 MG/GM VA CREA: TOPICAL_CREAM | Freq: Every day | VAGINAL | 12 refills | Status: DC

## 2018-01-09 MED ORDER — METHYLPREDNISOLONE 4 MG PO TBPK: ORAL_TABLET | ORAL | 0 refills | Status: DC

## 2018-01-09 NOTE — Progress Notes (Signed)
Patient: Megan Cervantes Female    DOB: [birth year]   72 y.o.   MRN: [id number] Visit Date: 01/09/2018  Today's Provider: Mar Daring, PA-C   Chief Complaint  Patient presents with  . Urinary Tract Infection   Subjective:    HPI Patient here to recheck her urine. She reports that today was her last day of the Cipro. She reports that she is not having any symptoms today. Last UCX was negative.  She is wondering why she has been having more UTI symptoms with negative cultures. She has been having frequency and dysuria.     Allergies  Allergen Reactions  . Azithromycin Diarrhea and Nausea Only  . Bactrim [Sulfamethoxazole-Trimethoprim] Nausea Only  . Penicillin G Rash     Current Outpatient Medications:  .  BIOTIN PO, Take by mouth., Disp: , Rfl:  .  Cholecalciferol (VITAMIN D) 2000 units CAPS, Take 1 capsule by mouth daily., Disp: , Rfl:  .  ciprofloxacin (CIPRO) 500 MG tablet, Take 1 tablet (500 mg total) by mouth 2 (two) times daily., Disp: 10 tablet, Rfl: 0 .  esomeprazole (NEXIUM) 40 MG capsule, TAKE 1 CAPSULE BY MOUTH ONCE DAILY, Disp: 90 capsule, Rfl: 0 .  Misc Natural Products (GLUCOSAMINE CHOND COMPLEX/MSM PO), Take 1 tablet by mouth daily., Disp: , Rfl:  .  Multiple Vitamins-Minerals (CENTRUM SILVER PO), Take 1 tablet by mouth daily., Disp: , Rfl:  .  pseudoephedrine-acetaminophen (TYLENOL SINUS) 30-500 MG TABS tablet, Take 1 tablet by mouth every 4 (four) hours as needed., Disp: , Rfl:  .  vitamin B-12 (CYANOCOBALAMIN) 1000 MCG tablet, Take 1,000 mcg by mouth daily., Disp: , Rfl:  .  vitamin C (ASCORBIC ACID) 250 MG tablet, Take 250 mg by mouth daily., Disp: , Rfl:  .  cephALEXin (KEFLEX) 500 MG capsule, Take 1 capsule (500 mg total) by mouth 2 (two) times daily. (Patient not taking: Reported on 01/09/2018), Disp: 14 capsule, Rfl: 0 .  ibuprofen (ADVIL,MOTRIN) 800 MG tablet, Take 1 tablet (800 mg total) by mouth every 8 (eight) hours as needed. (Patient not  taking: Reported on 12/26/2017), Disp: 30 tablet, Rfl: 5  Review of Systems  Constitutional: Negative.   Respiratory: Negative.   Cardiovascular: Negative.   Gastrointestinal: Negative.   Neurological: Negative.     Social History   Tobacco Use  . Smoking status: Never Smoker  . Smokeless tobacco: Never Used  Substance Use Topics  . Alcohol use: No   Objective:   BP 138/82 (BP Location: Left Arm, Patient Position: Sitting, Cuff Size: Normal)   Pulse 80   Temp 97.8 F (36.6 C) (Oral)   Resp 16   Wt 152 lb 9.6 oz (69.2 kg)   SpO2 97%   BMI 24.63 kg/m  Vitals:   01/09/18 1430  BP: 138/82  Pulse: 80  Resp: 16  Temp: 97.8 F (36.6 C)  TempSrc: Oral  SpO2: 97%  Weight: 152 lb 9.6 oz (69.2 kg)     Physical Exam  Constitutional: She is oriented to person, place, and time. She appears well-developed and well-nourished. No distress.  Cardiovascular: Normal rate, regular rhythm and normal heart sounds. Exam reveals no gallop and no friction rub.  No murmur heard. Pulmonary/Chest: Effort normal and breath sounds normal. No respiratory distress. She has no wheezes. She has no rales.  Abdominal: Soft. Normal appearance and bowel sounds are normal. She exhibits no distension and no mass. There is no hepatosplenomegaly. There is no tenderness. There  is no rebound, no guarding and no CVA tenderness.  Neurological: She is alert and oriented to person, place, and time.  Skin: Skin is warm and dry. She is not diaphoretic.        Assessment & Plan:     1. Cystitis UA normal today.  - POCT urinalysis dipstick  2. 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 Will do a trial of topical estrogen as below to see if this helps resolve symptoms. I will f/u with her in 2-3 months to see if symptoms have resolved.  - conjugated estrogens (PREMARIN) vaginal cream; Place vaginally at bedtime. May decrease to using M-W-F at night after 1st week  Dispense: 42.5 g;  Refill: 12  3. Arthritis Patient is having flare of knee OA following a vacation with a lot of walking. Will give 6 day Medrol taper and then to start Meloxicam once medrol dose pak is completed.  - methylPREDNISolone (MEDROL) 4 MG TBPK tablet; 6 day taper; take as directed on package instructions; do not take with meloxicam, aleve or ibuprofen  Dispense: 21 tablet; Refill: 0 - meloxicam (MOBIC) 7.5 MG tablet; Take 1 tablet (7.5 mg total) by mouth daily.  Dispense: 30 tablet; Refill: 0  4. Constipation, unspecified constipation type May also be contributing to UTI symptoms. Discussed starting Miralax one cap daily and adjusting dose as directed in case she develops watery stools.  - polyethylene glycol powder (GLYCOLAX/MIRALAX) powder; Take 17 g by mouth 2 (two) times daily as needed.  Dispense: 3350 g; Refill: [place], PA-C  [facility]

## 2018-01-09 NOTE — Patient Instructions (Signed)
Constipation, Adult Constipation is when a person:  Poops (has a bowel movement) fewer times in a week than normal.  Has a hard time pooping.  Has poop that is dry, hard, or bigger than normal.  Follow these instructions at home: Eating and drinking   Eat foods that have a lot of fiber, such as: ? Fresh fruits and vegetables. ? Whole grains. ? Beans.  Eat less of foods that are high in fat, low in fiber, or overly processed, such as: ? [place] fries. ? Hamburgers. ? Cookies. ? Candy. ? Soda.  Drink enough fluid to keep your pee (urine) clear or pale yellow. General instructions  Exercise regularly or as told by your doctor.  Go to the restroom when you feel like you need to poop. Do not hold it in.  Take over-the-counter and prescription medicines only as told by your doctor. These include any fiber supplements.  Do pelvic floor retraining exercises, such as: ? Doing deep breathing while relaxing your lower belly (abdomen). ? Relaxing your pelvic floor while pooping.  Watch your condition for any changes.  Keep all follow-up visits as told by your doctor. This is important. Contact a doctor if:  You have pain that gets worse.  You have a fever.  You have not pooped for 4 days.  You throw up (vomit).  You are not hungry.  You lose weight.  You are bleeding from the anus.  You have thin, pencil-like poop (stool). Get help right away if:  You have a fever, and your symptoms suddenly get worse.  You leak poop or have blood in your poop.  Your belly feels hard or bigger than normal (is bloated).  You have very bad belly pain.  You feel dizzy or you faint. This information is not intended to replace advice given to you by your health care provider. Make sure you discuss any questions you have with your health care provider. Document Released: 10/12/2007 Document Revised: 11/13/2015 Document Reviewed: 10/14/2015 Elsevier Interactive Patient Education   2018 [employer]. Atrophic Vaginitis Atrophic vaginitis is when the tissues that line the vagina become dry and thin. This is caused by a drop in estrogen. Estrogen helps:  To keep the vagina moist.  To make a clear fluid that helps: ? To lubricate the vagina for sex. ? To protect the vagina from infection.  If the lining of the vagina is dry and thin, it may:  Make sex painful. It may also cause bleeding.  Cause a feeling of: ? Burning. ? Irritation. ? Itchiness.  Make an exam of your vagina painful. It may also cause bleeding.  Make you lose interest in sex.  Cause a burning feeling when you pee.  Make your vaginal fluid (discharge) brown or yellow.  For some women, there are no symptoms. This condition is most common in women who do not get their regular menstrual periods anymore (menopause). This often starts when a woman is 36-38 years old. Follow these instructions at home:  Take medicines only as told by your doctor. Do not use any herbal or alternative medicines unless your doctor says it is okay.  Use over-the-counter products for dryness only as told by your doctor. These include: ? Creams. ? Lubricants. ? Moisturizers.  Do not douche.  Do not use products that can make your vagina dry. These include: ? Scented feminine sprays. ? Scented tampons. ? Scented soaps.  If it hurts to have sex, tell your sexual partner. Contact a doctor  if:  Your discharge looks different than normal.  Your vagina has an unusual smell.  You have new symptoms.  Your symptoms do not get better with treatment.  Your symptoms get worse. This information is not intended to replace advice given to you by your health care provider. Make sure you discuss any questions you have with your health care provider. Document Released: 10/12/2007 Document Revised: 10/01/2015 Document Reviewed: 04/16/2014 Elsevier Interactive Patient Education  2018 Elsevier Inc. Conjugated Estrogens  vaginal cream What is this medicine? CONJUGATED ESTROGENS (CON ju gate ed ESS troe jenz) are a mixture of female hormones. This cream can help relieve symptoms associated with menopause.like vaginal dryness and irritation. This medicine may be used for other purposes; ask your health care provider or pharmacist if you have questions. COMMON BRAND NAME(S): Premarin What should I tell my health care provider before I take this medicine? They need to know if you have any of these conditions: -abnormal vaginal bleeding -blood vessel disease or blood clots -breast, cervical, endometrial, or uterine cancer -dementia -diabetes -gallbladder disease -heart disease or recent heart attack -high blood pressure -high cholesterol -high level of calcium in the blood -hysterectomy -kidney disease -liver disease -migraine headaches -protein C deficiency -protein S deficiency -stroke -systemic lupus erythematosus (SLE) -tobacco smoker -an unusual or allergic reaction to estrogens other medicines, foods, dyes, or preservatives -pregnant or trying to get pregnant -breast-feeding How should I use this medicine? This medicine is for use in the vagina only. Do not take by mouth. Follow the directions on the prescription label. Use at bedtime unless otherwise directed by your doctor or health care professional. Use the special applicator supplied with the cream. Wash hands before and after use. Fill the applicator with the cream and remove from the tube. Lie on your back, part and bend your knees. Insert the applicator into the vagina and push the plunger to expel the cream into the vagina. Wash the applicator with warm soapy water and rinse well. Use exactly as directed for the complete length of time prescribed. Do not stop using except on the advice of your doctor or health care professional. Talk to your pediatrician regarding the use of this medicine in children. Special care may be needed. A patient  package insert for the product will be given with each prescription and refill. Read this sheet carefully each time. The sheet may change frequently. Overdosage: If you think you have taken too much of this medicine contact a poison control center or emergency room at once. NOTE: This medicine is only for you. Do not share this medicine with others. What if I miss a dose? If you miss a dose, use it as soon as you can. If it is almost time for your next dose, use only that dose. Do not use double or extra doses. What may interact with this medicine? Do not take this medicine with any of the following medications: -aromatase inhibitors like aminoglutethimide, anastrozole, exemestane, letrozole, testolactone This medicine may also interact with the following medications: -barbiturates used for inducing sleep or treating seizures -carbamazepine -grapefruit juice -medicines for fungal infections like itraconazole and ketoconazole -raloxifene or tamoxifen -rifabutin -rifampin -rifapentine -ritonavir -some antibiotics used to treat infections -St. John's Wort -warfarin This list may not describe all possible interactions. Give your health care provider a list of all the medicines, herbs, non-prescription drugs, or dietary supplements you use. Also tell them if you smoke, drink alcohol, or use illegal drugs. Some items may interact with  your medicine. What should I watch for while using this medicine? Visit your health care professional for regular checks on your progress. You will need a regular breast and pelvic exam. You should also discuss the need for regular mammograms with your health care professional, and follow his or her guidelines. This medicine can make your body retain fluid, making your fingers, hands, or ankles swell. Your blood pressure can go up. Contact your doctor or health care professional if you feel you are retaining fluid. If you have any reason to think you are pregnant;  stop taking this medicine at once and contact your doctor or health care professional. Tobacco smoking increases the risk of getting a blood clot or having a stroke, especially if you are more than 71 years old. You are strongly advised not to smoke. If you wear contact lenses and notice visual changes, or if the lenses begin to feel uncomfortable, consult your eye care specialist. If you are going to have elective surgery, you may need to stop taking this medicine beforehand. Consult your health care professional for advice prior to scheduling the surgery. What side effects may I notice from receiving this medicine? Side effects that you should report to your doctor or health care professional as soon as possible: -allergic reactions like skin rash, itching or hives, swelling of the face, lips, or tongue -breast tissue changes or discharge -changes in vision -chest pain -confusion, trouble speaking or understanding -dark urine -general ill feeling or flu-like symptoms -light-colored stools -nausea, vomiting -pain, swelling, warmth in the leg -right upper belly pain -severe headaches -shortness of breath -sudden numbness or weakness of the face, arm or leg -trouble walking, dizziness, loss of balance or coordination -unusual vaginal bleeding -yellowing of the eyes or skin Side effects that usually do not require medical attention (report to your doctor or health care professional if they continue or are bothersome): -hair loss -increased hunger or thirst -increased urination -symptoms of vaginal infection like itching, irritation or unusual discharge -unusually weak or tired This list may not describe all possible side effects. Call your doctor for medical advice about side effects. You may report side effects to FDA at 1-800-FDA-1088. Where should I keep my medicine? Keep out of the reach of children. Store at room temperature between 15 and 30 degrees C (59 and 86 degrees F). Throw  away any unused medicine after the expiration date. NOTE: This sheet is a summary. It may not cover all possible information. If you have questions about this medicine, talk to your doctor, pharmacist, or health care provider.  2018 Elsevier/Gold Standard (2010-07-28 09:20:36)

## 2018-01-20 ENCOUNTER — Other Ambulatory Visit: Payer: Self-pay | Admitting: Physician Assistant

## 2018-01-20 DIAGNOSIS — K219 Gastro-esophageal reflux disease without esophagitis: Secondary | ICD-10-CM

## 2018-02-19 ENCOUNTER — Encounter: Payer: Self-pay | Admitting: Physician Assistant

## 2018-02-19 ENCOUNTER — Ambulatory Visit (INDEPENDENT_AMBULATORY_CARE_PROVIDER_SITE_OTHER): Payer: Medicare Other | Admitting: Physician Assistant

## 2018-02-19 VITALS — BP 160/90 | HR 83 | Temp 97.8°F | Resp 16 | Wt 151.2 lb

## 2018-02-19 DIAGNOSIS — M170 Bilateral primary osteoarthritis of knee: Secondary | ICD-10-CM

## 2018-02-19 DIAGNOSIS — N3090 Cystitis, unspecified without hematuria: Secondary | ICD-10-CM | POA: Diagnosis not present

## 2018-02-19 DIAGNOSIS — J0100 Acute maxillary sinusitis, unspecified: Secondary | ICD-10-CM

## 2018-02-19 LAB — POCT URINALYSIS DIPSTICK
APPEARANCE: NORMAL
BILIRUBIN UA: NEGATIVE
Blood, UA: NEGATIVE
Glucose, UA: NEGATIVE
KETONES UA: NEGATIVE
Leukocytes, UA: NEGATIVE
Nitrite, UA: NEGATIVE
PH UA: 6 (ref 5.0–8.0)
Protein, UA: NEGATIVE
Spec Grav, UA: 1.025 (ref 1.010–1.025)
UROBILINOGEN UA: 0.2 U/dL

## 2018-02-19 MED ORDER — METHYLPREDNISOLONE ACETATE 80 MG/ML IJ SUSP
80.0000 mg | Freq: Once | INTRAMUSCULAR | Status: AC
  Administered 2018-02-19: 80 mg via INTRA_ARTICULAR

## 2018-02-19 MED ORDER — DOXYCYCLINE HYCLATE 100 MG PO TABS: 100.0000 mg | ORAL_TABLET | Freq: Two times a day (BID) | ORAL | 0 refills | Status: DC

## 2018-02-19 NOTE — Patient Instructions (Signed)
Knee Injection, Care After  Refer to this sheet in the next few weeks. These instructions provide you with information about caring for yourself after your procedure. Your health care provider may also give you more specific instructions. Your treatment has been planned according to current medical practices, but problems sometimes occur. Call your health care provider if you have any problems or questions after your procedure.  What can I expect after the procedure?  After the procedure, it is common to have:   Soreness.   Warmth.   Swelling.    You may have more pain, swelling, and warmth than you did before the injection. This reaction may last for about one day.  Follow these instructions at home:  Bathing   If you were given a bandage (dressing), keep it dry until your health care provider says it can be removed. Ask your health care provider when you can start showering or taking a bath.  Managing pain, stiffness, and swelling   If directed, apply ice to the injection area:  ? Put ice in a plastic bag.  ? Place a towel between your skin and the bag.  ? Leave the ice on for 20 minutes, 2-3 times per day.   Do not apply heat to your knee.   Raise the injection area above the level of your heart while you are sitting or lying down.  Activity   Avoid strenuous activities for as long as directed by your health care provider. Ask your health care provider when you can return to your normal activities.  General instructions   Take medicines only as directed by your health care provider.   Do not take aspirin or other over-the-counter medicines unless your health care provider says you can.   Check your injection site every day for signs of infection. Watch for:  ? Redness, swelling, or pain.  ? Fluid, blood, or pus.   Follow your health care provider's instructions about dressing changes and removal.  Contact a health care provider if:   You have symptoms at your injection site that last longer than  two days after your procedure.   You have redness, swelling, or pain in your injection area.   You have fluid, blood, or pus coming from your injection site.   You have warmth in your injection area.   You have a fever.   Your pain is not controlled with medicine.  Get help right away if:   Your knee turns very red.   Your knee becomes very swollen.   Your knee pain is severe.  This information is not intended to replace advice given to you by your health care provider. Make sure you discuss any questions you have with your health care provider.  Document Released: 05/16/2014 Document Revised: 12/30/2015 Document Reviewed: 03/05/2014  Elsevier Interactive Patient Education  2018 Elsevier Inc.

## 2018-02-19 NOTE — Progress Notes (Signed)
Patient: Megan Cervantes Female    DOB: [birth year]   72 y.o.   MRN: [id number] Visit Date: 02/19/2018  Today's Provider: Mar Daring, PA-C   Chief Complaint  Patient presents with  . Follow-up    urine   Subjective:    HPI Patient here to recheck her urine. Patient reports that she is going on a [place] trip. Patient reports the following symptoms none.  Patient also here requesting injection in her both knees. Reports that they are hurting. Patient of hx of arthritis. Patient took Medrol last month (01/09/18) and reports it helped with the joints. She also reports that when she took the Medrol she developed some cramping on her lower extremities.  Patient also with c/o watery eyes,cough,runny nose on her right side, post nasal drip and sinus pressure. Denies fever,sore throat. Patient tried Tylenol Sinus since Thursday.    Allergies  Allergen Reactions  . Azithromycin Diarrhea and Nausea Only  . Bactrim [Sulfamethoxazole-Trimethoprim] Nausea Only  . Penicillin G Rash     Current Outpatient Medications:  .  BIOTIN PO, Take by mouth., Disp: , Rfl:  .  Cholecalciferol (VITAMIN D) 2000 units CAPS, Take 1 capsule by mouth daily., Disp: , Rfl:  .  conjugated estrogens (PREMARIN) vaginal cream, Place vaginally at bedtime. May decrease to using M-W-F at night after 1st week, Disp: 42.5 g, Rfl: 12 .  CRANBERRY CONCENTRATE PO, Take by mouth., Disp: , Rfl:  .  esomeprazole (NEXIUM) 40 MG capsule, TAKE 1 CAPSULE BY MOUTH ONCE DAILY, Disp: 90 capsule, Rfl: 1 .  Misc Natural Products (GLUCOSAMINE CHOND COMPLEX/MSM PO), Take 1 tablet by mouth daily., Disp: , Rfl:  .  Multiple Vitamins-Minerals (CENTRUM SILVER PO), Take 1 tablet by mouth daily., Disp: , Rfl:  .  polyethylene glycol powder (GLYCOLAX/MIRALAX) powder, Take 17 g by mouth 2 (two) times daily as needed., Disp: 3350 g, Rfl: 1 .  pseudoephedrine-acetaminophen (TYLENOL SINUS) 30-500 MG TABS tablet, Take 1 tablet by  mouth every 4 (four) hours as needed., Disp: , Rfl:  .  TURMERIC PO, Take by mouth., Disp: , Rfl:  .  vitamin B-12 (CYANOCOBALAMIN) 1000 MCG tablet, Take 1,000 mcg by mouth daily., Disp: , Rfl:  .  vitamin C (ASCORBIC ACID) 250 MG tablet, Take 250 mg by mouth daily., Disp: , Rfl:  .  ibuprofen (ADVIL,MOTRIN) 800 MG tablet, Take 1 tablet (800 mg total) by mouth every 8 (eight) hours as needed. (Patient not taking: Reported on 12/26/2017), Disp: 30 tablet, Rfl: 5 .  meloxicam (MOBIC) 7.5 MG tablet, Take 1 tablet (7.5 mg total) by mouth daily. (Patient not taking: Reported on 02/19/2018), Disp: 30 tablet, Rfl: 0  Review of Systems  Constitutional: Negative.   HENT: Positive for congestion, sinus pain and sore throat. Negative for ear pain.   Respiratory: Positive for cough. Negative for chest tightness, shortness of breath and wheezing.   Cardiovascular: Negative.   Gastrointestinal: Negative.   Musculoskeletal: Positive for arthralgias.    Social History   Tobacco Use  . Smoking status: Never Smoker  . Smokeless tobacco: Never Used  Substance Use Topics  . Alcohol use: No   Objective:   BP (!) 160/90 (BP Location: Left Arm, Patient Position: Sitting, Cuff Size: Normal)   Pulse 83   Temp 97.8 F (36.6 C) (Oral)   Resp 16   Wt 151 lb 3.2 oz (68.6 kg)   SpO2 95%   BMI 24.40 kg/m  Vitals:  02/19/18 1604  BP: (!) 160/90  Pulse: 83  Resp: 16  Temp: 97.8 F (36.6 C)  TempSrc: Oral  SpO2: 95%  Weight: 151 lb 3.2 oz (68.6 kg)     Physical Exam  Constitutional: She appears well-developed and well-nourished. No distress.  HENT:  Head: Normocephalic and atraumatic.  Right Ear: Hearing, tympanic membrane, external ear and ear canal normal.  Left Ear: Hearing, tympanic membrane, external ear and ear canal normal.  Nose: Nose normal.  Mouth/Throat: Uvula is midline, oropharynx is clear and moist and mucous membranes are normal. No oropharyngeal exudate.  Eyes: Pupils are equal,  round, and reactive to light. Conjunctivae are normal. Right eye exhibits no discharge. Left eye exhibits no discharge. No scleral icterus.  Neck: Normal range of motion. Neck supple. No tracheal deviation present. No thyromegaly present.  Cardiovascular: Normal rate, regular rhythm and normal heart sounds. Exam reveals no gallop and no friction rub.  No murmur heard. Pulmonary/Chest: Effort normal and breath sounds normal. No stridor. No respiratory distress. She has no wheezes. She has no rales.  Musculoskeletal:       Right knee: She exhibits normal range of motion, no swelling and no effusion. Tenderness found. Medial joint line tenderness noted.       Left knee: She exhibits normal range of motion, no swelling and no effusion. Tenderness found. Medial joint line tenderness noted.  Lymphadenopathy:    She has no cervical adenopathy.  Skin: Skin is warm and dry. She is not diaphoretic.  Vitals reviewed.      Assessment & Plan:     1. Primary osteoarthritis of both knees Bilateral steroid injections given each knee, see procedure notes as below. Tolerated well. No complications.  - methylPREDNISolone acetate (DEPO-MEDROL) injection 80 mg - methylPREDNISolone acetate (DEPO-MEDROL) injection 80 mg  2. Acute non-recurrent maxillary sinusitis Worsening symptoms that have not responded to OTC medications. Will give Doxycycline as below. Continue allergy medications. Stay well hydrated and get plenty of rest. Call if no symptom improvement or if symptoms worsen. - doxycycline (VIBRA-TABS) 100 MG tablet; Take 1 tablet (100 mg total) by mouth 2 (two) times daily.  Dispense: 20 tablet; Refill: 0  3. Recurrent cystitis with negative culture UA normal today.  - POCT urinalysis dipstick  Procedure Note: Benefits, risks (including infection, tattooing, adipose dimpling, and tendon rupture) and alternatives were explained to the patient. All questions were sought and answered.  Patient agreed to  continue and verbal consent was obtained.   An aspiration and steroid injection was performed on right and left knee using 4cc of 1% plain Xyloocaine and 80 mg of depo-medrol each knee. There was minimal bleeding. Hemostasis was intact. A dry dressing was applied. The procedure was well tolerated.       Mar Daring, PA-C  [facility]

## 2018-03-19 DIAGNOSIS — Z23 Encounter for immunization: Secondary | ICD-10-CM | POA: Diagnosis not present

## 2018-07-02 ENCOUNTER — Encounter: Payer: Self-pay | Admitting: Physician Assistant

## 2018-07-02 ENCOUNTER — Ambulatory Visit (INDEPENDENT_AMBULATORY_CARE_PROVIDER_SITE_OTHER): Payer: Medicare Other | Admitting: Physician Assistant

## 2018-07-02 VITALS — BP 167/97 | HR 72 | Temp 97.7°F | Resp 16 | Wt 153.0 lb

## 2018-07-02 DIAGNOSIS — N3090 Cystitis, unspecified without hematuria: Secondary | ICD-10-CM | POA: Diagnosis not present

## 2018-07-02 DIAGNOSIS — M1990 Unspecified osteoarthritis, unspecified site: Secondary | ICD-10-CM

## 2018-07-02 MED ORDER — CEPHALEXIN 500 MG PO CAPS: 500.0000 mg | ORAL_CAPSULE | Freq: Two times a day (BID) | ORAL | 0 refills | Status: DC

## 2018-07-02 NOTE — Patient Instructions (Signed)
Magnesium sulfate 250mg  daily or every other day for cramping depending upon BM. Meloxciam for a few days at a time with arthritis

## 2018-07-02 NOTE — Progress Notes (Signed)
Patient: Megan Cervantes Female    DOB: 01-Jul-1945   73 y.o.   MRN: [id number] Visit Date: 07/02/2018  Today's Provider: Mar Daring, PA-C   Chief Complaint  Patient presents with  . Urinary Frequency   Subjective:     Urinary Frequency   This is a new problem. The current episode started in the past 7 days (She started on Friday). The problem occurs every urination. The problem has been gradually worsening. The quality of the pain is described as burning and shooting. The pain is at a severity of 7/10 (while urinating). The pain is moderate. There has been no fever. Associated symptoms include flank pain, frequency and urgency. Pertinent negatives include no hematuria. Discharge: right flank pain. Associated symptoms comments: Burning. Treatments tried: AZO and some Cipro from before. The treatment provided no relief. Her past medical history is significant for recurrent UTIs.    Patient also would like to tak about her arthritis reports she aches in her neck, back foot, knees. She reports that it has flare up and she took Tylenol. She reports that she has been given meloxicam but she hasn't taken it. Reports that when she took it last time it gave her some cramping and helped with her arthritis but this time she still has the bottle full of Meloxicam.  Allergies  Allergen Reactions  . Azithromycin Diarrhea and Nausea Only  . Bactrim [Sulfamethoxazole-Trimethoprim] Nausea Only  . Penicillin G Rash     Current Outpatient Medications:  .  BIOTIN PO, Take by mouth., Disp: , Rfl:  .  Cholecalciferol (VITAMIN D) 2000 units CAPS, Take 1 capsule by mouth daily., Disp: , Rfl:  .  CRANBERRY CONCENTRATE PO, Take by mouth., Disp: , Rfl:  .  esomeprazole (NEXIUM) 40 MG capsule, TAKE 1 CAPSULE BY MOUTH ONCE DAILY, Disp: 90 capsule, Rfl: 1 .  ibuprofen (ADVIL,MOTRIN) 800 MG tablet, Take 1 tablet (800 mg total) by mouth every 8 (eight) hours as needed., Disp: 30 tablet, Rfl: 5 .   Misc Natural Products (GLUCOSAMINE CHOND COMPLEX/MSM PO), Take 1 tablet by mouth daily., Disp: , Rfl:  .  Multiple Vitamins-Minerals (CENTRUM SILVER PO), Take 1 tablet by mouth daily., Disp: , Rfl:  .  polyethylene glycol powder (GLYCOLAX/MIRALAX) powder, Take 17 g by mouth 2 (two) times daily as needed., Disp: 3350 g, Rfl: 1 .  pseudoephedrine-acetaminophen (TYLENOL SINUS) 30-500 MG TABS tablet, Take 1 tablet by mouth every 4 (four) hours as needed., Disp: , Rfl:  .  TURMERIC PO, Take by mouth., Disp: , Rfl:  .  vitamin B-12 (CYANOCOBALAMIN) 1000 MCG tablet, Take 1,000 mcg by mouth daily., Disp: , Rfl:  .  vitamin C (ASCORBIC ACID) 250 MG tablet, Take 250 mg by mouth daily., Disp: , Rfl:  .  meloxicam (MOBIC) 7.5 MG tablet, Take 1 tablet (7.5 mg total) by mouth daily. (Patient not taking: Reported on 02/19/2018), Disp: 30 tablet, Rfl: 0  Review of Systems  Constitutional: Negative.   Respiratory: Negative.   Cardiovascular: Negative.   Gastrointestinal: Negative.   Genitourinary: Positive for flank pain, frequency and urgency. Negative for hematuria.  Musculoskeletal: Positive for arthralgias.    Social History   Tobacco Use  . Smoking status: Never Smoker  . Smokeless tobacco: Never Used  Substance Use Topics  . Alcohol use: No      Objective:   BP (!) 167/97 (BP Location: Left Arm, Patient Position: Sitting, Cuff Size: Large)   Pulse 72  Temp 97.7 F (36.5 C) (Oral)   Resp 16   Wt 153 lb (69.4 kg)   BMI 24.69 kg/m  Vitals:   07/02/18 1524  BP: (!) 167/97  Pulse: 72  Resp: 16  Temp: 97.7 F (36.5 C)  TempSrc: Oral  Weight: 153 lb (69.4 kg)     Physical Exam Vitals signs reviewed.  Constitutional:      General: She is not in acute distress.    Appearance: Normal appearance. She is well-developed. She is not diaphoretic.  Cardiovascular:     Rate and Rhythm: Normal rate and regular rhythm.     Heart sounds: Normal heart sounds. No murmur. No friction rub. No  gallop.   Pulmonary:     Effort: Pulmonary effort is normal. No respiratory distress.     Breath sounds: Normal breath sounds. No wheezing or rales.  Abdominal:     General: Bowel sounds are normal. There is no distension.     Palpations: Abdomen is soft. There is no mass.     Tenderness: There is abdominal tenderness in the suprapubic area. There is no guarding or rebound.  Skin:    General: Skin is warm and dry.  Neurological:     Mental Status: She is alert and oriented to person, place, and time.        Assessment & Plan    1. Cystitis UA unable to read due to AZO. Will send for culture. Will treat with keflex as below for UTI. Push fluids. I will f/u pending results. Due for CPE in April 2020.  - POCT urinalysis dipstick - Urine Culture - cephALEXin (KEFLEX) 500 MG capsule; Take 1 capsule (500 mg total) by mouth 2 (two) times daily.  Dispense: 20 capsule; Refill: 0  2. Arthritis Discussed using meloxicam for a few days, then stopping and transition back to tylenol. May also take tylenol with meloxicam if needed. Use magnesium sulfate 250mg  prn for cramping of lower legs. Call if worsening.      Mar Daring, PA-C  [facility]

## 2018-07-04 ENCOUNTER — Telehealth: Payer: Self-pay

## 2018-07-04 LAB — URINE CULTURE

## 2018-07-04 NOTE — Telephone Encounter (Signed)
Viewed by Leslie Andrea on 07/04/2018 6:40 PM

## 2018-07-04 NOTE — Telephone Encounter (Signed)
-----   Message from Mar Daring, [place] sent at 07/04/2018  6:04 PM EST ----- Urine culture was positive but should be susceptible to the antibiotic you were placed on. Please continue until completed.

## 2018-07-05 ENCOUNTER — Telehealth: Payer: Self-pay | Admitting: Physician Assistant

## 2018-07-05 DIAGNOSIS — N3000 Acute cystitis without hematuria: Secondary | ICD-10-CM

## 2018-07-05 NOTE — Telephone Encounter (Signed)
Please advise 

## 2018-07-05 NOTE — Telephone Encounter (Signed)
Pt cannot take the Cipro for UTI because she has leg cramps several times a night. Wanting to know what else she can take?  Please call pt back at [phone]   Uses the following pharmacy: [facility] [place], [place] [phone] (Phone) [phone] (Fax)   Thanks, [facility]

## 2018-07-06 MED ORDER — CIPROFLOXACIN HCL 250 MG PO TABS: 250.0000 mg | ORAL_TABLET | Freq: Two times a day (BID) | ORAL | 0 refills | Status: DC

## 2018-07-06 NOTE — Telephone Encounter (Signed)
Patient is not on Cipro. She is on cephalexin (keflex)

## 2018-07-06 NOTE — Telephone Encounter (Signed)
Patient reports that her symptoms are better and was asking if she really need to take the antibiotic? Advised patient that preferably yes but it was up to her.

## 2018-07-06 NOTE — Telephone Encounter (Signed)
Changed to Cipro

## 2018-07-06 NOTE — Telephone Encounter (Signed)
Patient was advised that the medication is not Cipro and it is keflex. Patient states that she is still having cramps in her legs and feet. Patient states that she had the same side effect when [facility] prescribed the Keflex for her too.

## 2018-07-18 ENCOUNTER — Telehealth: Payer: Self-pay | Admitting: Physician Assistant

## 2018-07-18 DIAGNOSIS — N3000 Acute cystitis without hematuria: Secondary | ICD-10-CM

## 2018-07-18 MED ORDER — CEPHALEXIN 500 MG PO CAPS: 500.0000 mg | ORAL_CAPSULE | Freq: Two times a day (BID) | ORAL | 0 refills | Status: DC

## 2018-07-18 NOTE — Telephone Encounter (Signed)
Pt was in 2 /24 to see Sonia Baller and was given a prescription for a UTI.  She is still having a little pain when she urinates/  She did not take the CIPRO she took Cephalexin .  Pt's CB#  [phone]  Thanks Con Memos

## 2018-07-18 NOTE — Telephone Encounter (Signed)
Patient reports that she took keflex and not cipro. Patient reports she is still having a "twinge"  After she urinates. Patient concerned that UTI has not cleared and is requesting to a second round of antibiotic sent to [facility].

## 2018-07-18 NOTE — Telephone Encounter (Signed)
So was it the cephalexin that caused the muscle cramps?

## 2018-07-18 NOTE — Telephone Encounter (Signed)
Keflex sent in.

## 2018-07-18 NOTE — Telephone Encounter (Signed)
Please advise 

## 2018-08-11 ENCOUNTER — Other Ambulatory Visit: Payer: Self-pay | Admitting: Physician Assistant

## 2018-08-11 DIAGNOSIS — K219 Gastro-esophageal reflux disease without esophagitis: Secondary | ICD-10-CM

## 2018-08-17 ENCOUNTER — Ambulatory Visit: Payer: Self-pay

## 2018-08-17 ENCOUNTER — Encounter: Payer: Medicare Other | Admitting: Physician Assistant

## 2018-09-03 ENCOUNTER — Encounter: Payer: Self-pay | Admitting: Physician Assistant

## 2018-09-03 ENCOUNTER — Ambulatory Visit: Payer: Self-pay

## 2018-10-30 NOTE — Progress Notes (Signed)
Patient: Megan Cervantes Female    DOB: 1946/01/24   73 y.o.   MRN: [id number] Visit Date: 10/31/2018  Today's Provider: Mar Daring, PA-C   Chief Complaint  Patient presents with  . Follow-up   Subjective:   PATIENT HAD AWV TODAY WITH NHA AT 3:00 PM.  HPI Arthritis FROM 08/15/2017-Stable,no changes.  Hypercholesterolemia From 08/15/2017-labs checked, no changes.  Gastroesophageal reflux disease without esophagitis From 08/15/2017-labs checked, no changes.  Elevated glucose       From 08/15/2017-labs checked, no changes.  Patient has no complaints today. She reports she is feeling well and stable.    Allergies  Allergen Reactions  . Azithromycin Diarrhea and Nausea Only  . Bactrim [Sulfamethoxazole-Trimethoprim] Nausea Only  . Penicillin G Rash     Current Outpatient Medications:  .  BIOTIN PO, Take by mouth daily. , Disp: , Rfl:  .  cephALEXin (KEFLEX) 500 MG capsule, Take 1 capsule (500 mg total) by mouth 2 (two) times daily., Disp: 10 capsule, Rfl: 0 .  Cholecalciferol (VITAMIN D) 2000 units CAPS, Take 1 capsule by mouth daily., Disp: , Rfl:  .  CRANBERRY CONCENTRATE PO, Take by mouth daily. , Disp: , Rfl:  .  esomeprazole (NEXIUM) 40 MG capsule, TAKE 1 CAPSULE BY MOUTH ONCE DAILY, Disp: 90 capsule, Rfl: 1 .  ibuprofen (ADVIL,MOTRIN) 800 MG tablet, Take 1 tablet (800 mg total) by mouth every 8 (eight) hours as needed., Disp: 30 tablet, Rfl: 5 .  meloxicam (MOBIC) 7.5 MG tablet, Take 1 tablet (7.5 mg total) by mouth daily., Disp: 30 tablet, Rfl: 0 .  Misc Natural Products (GLUCOSAMINE CHOND COMPLEX/MSM PO), Take 1 tablet by mouth daily., Disp: , Rfl:  .  Multiple Vitamins-Minerals (CENTRUM SILVER PO), Take 1 tablet by mouth daily., Disp: , Rfl:  .  polyethylene glycol powder (GLYCOLAX/MIRALAX) powder, Take 17 g by mouth 2 (two) times daily as needed., Disp: 3350 g, Rfl: 1 .  pseudoephedrine-acetaminophen (TYLENOL SINUS) 30-500 MG TABS tablet, Take 1 tablet  by mouth every 4 (four) hours as needed., Disp: , Rfl:  .  TURMERIC PO, Take by mouth daily. , Disp: , Rfl:  .  vitamin B-12 (CYANOCOBALAMIN) 1000 MCG tablet, Take 1,000 mcg by mouth daily., Disp: , Rfl:  .  vitamin C (ASCORBIC ACID) 250 MG tablet, Take 250 mg by mouth daily., Disp: , Rfl:   Review of Systems  Constitutional: Negative.   HENT: Negative.   Eyes: Negative.   Respiratory: Negative.   Cardiovascular: Negative.   Gastrointestinal: Negative.   Endocrine: Negative.   Genitourinary: Negative.   Musculoskeletal: Positive for arthralgias and myalgias.  Skin: Negative.   Allergic/Immunologic: Negative.   Neurological: Negative.   Hematological: Negative.   Psychiatric/Behavioral: Negative.     Social History   Tobacco Use  . Smoking status: Never Smoker  . Smokeless tobacco: Never Used  Substance Use Topics  . Alcohol use: No      Objective:   BP 130/86 (BP Location: Left Arm, Patient Position: Sitting, Cuff Size: Large)   Pulse 93   Temp 98.1 F (36.7 C) (Oral)   Resp 16   Ht 5\' 6"  (1.676 m)   Wt 150 lb (68 kg)   SpO2 97%   BMI 24.21 kg/m  Vitals:   10/31/18 1511  BP: 130/86  Pulse: 93  Resp: 16  Temp: 98.1 F (36.7 C)  TempSrc: Oral  SpO2: 97%  Weight: 150 lb (68 kg)  Height: 5\' 6"  (  1.676 m)     Physical Exam Vitals signs reviewed.  Constitutional:      General: She is not in acute distress.    Appearance: Normal appearance. She is well-developed and normal weight. She is not ill-appearing or diaphoretic.  HENT:     Head: Normocephalic and atraumatic.     Right Ear: Tympanic membrane, ear canal and external ear normal.     Left Ear: Tympanic membrane, ear canal and external ear normal.     Nose: Nose normal.     Mouth/Throat:     Mouth: Mucous membranes are moist.     Pharynx: No oropharyngeal exudate.  Eyes:     General: No scleral icterus.       Right eye: No discharge.        Left eye: No discharge.     Extraocular Movements:  Extraocular movements intact.     Conjunctiva/sclera: Conjunctivae normal.     Pupils: Pupils are equal, round, and reactive to light.  Neck:     Musculoskeletal: Normal range of motion and neck supple.     Thyroid: No thyromegaly.     Vascular: No carotid bruit or JVD.     Trachea: No tracheal deviation.  Cardiovascular:     Rate and Rhythm: Normal rate and regular rhythm.     Pulses: Normal pulses.     Heart sounds: Normal heart sounds. No murmur. No friction rub. No gallop.   Pulmonary:     Effort: Pulmonary effort is normal. No respiratory distress.     Breath sounds: Normal breath sounds. No wheezing or rales.  Chest:     Chest wall: No tenderness.  Abdominal:     General: Bowel sounds are normal. There is no distension.     Palpations: Abdomen is soft. There is no mass.     Tenderness: There is no abdominal tenderness. There is no guarding or rebound.  Musculoskeletal: Normal range of motion.        General: No tenderness.  Lymphadenopathy:     Cervical: No cervical adenopathy.  Skin:    General: Skin is warm and dry.     Capillary Refill: Capillary refill takes less than 2 seconds.     Findings: No rash.  Neurological:     General: No focal deficit present.     Mental Status: She is alert and oriented to person, place, and time. Mental status is at baseline.     Cranial Nerves: No cranial nerve deficit.     Motor: No weakness.     Gait: Gait normal.  Psychiatric:        Mood and Affect: Mood normal.        Behavior: Behavior normal.        Thought Content: Thought content normal.        Judgment: Judgment normal.     No results found for any visits on 10/31/18.     Assessment & Plan    1. Gastroesophageal reflux disease without esophagitis Was on nexium 40mg . Reports symptoms worsening. Will change therapy to protonix as below. Call if not improving.  - CBC w/Diff/Platelet - Comprehensive Metabolic Panel (CMET) - pantoprazole (PROTONIX) 40 MG tablet; Take 1  tablet (40 mg total) by mouth daily.  Dispense: 90 tablet; Refill: 3  2. Elevated glucose Diet controlled. Will check labs as below and f/u pending results. - CBC w/Diff/Platelet - Comprehensive Metabolic Panel (CMET) - HgB A1c  3. Hypercholesterolemia Diet controlled. Will check labs as below  and f/u pending results. - CBC w/Diff/Platelet - Comprehensive Metabolic Panel (CMET) - Lipid Profile  4. Breast cancer screening No family history. Performs self breast exams. [place] information provided.   5. Arthritis Stable. Diagnosis pulled for medication refill. Continue current medical treatment plan. - ibuprofen (ADVIL) 800 MG tablet; Take 1 tablet (800 mg total) by mouth every 8 (eight) hours as needed.  Dispense: 30 tablet; Refill: 5  6. Primary osteoarthritis of both knees Had steroid injections bilaterally in Oct 2019 with relief until now. Requesting orthopedic referral for consideration of the hyaluronic acid injections (Synvisc, etc). Referral placed to [facility] as below.  - Ambulatory referral to [place], PA-C  [facility]

## 2018-10-31 ENCOUNTER — Ambulatory Visit (INDEPENDENT_AMBULATORY_CARE_PROVIDER_SITE_OTHER): Payer: Medicare Other

## 2018-10-31 ENCOUNTER — Encounter: Payer: Self-pay | Admitting: Physician Assistant

## 2018-10-31 ENCOUNTER — Ambulatory Visit (INDEPENDENT_AMBULATORY_CARE_PROVIDER_SITE_OTHER): Payer: Medicare Other | Admitting: Physician Assistant

## 2018-10-31 ENCOUNTER — Other Ambulatory Visit: Payer: Self-pay

## 2018-10-31 VITALS — BP 130/86 | HR 93 | Temp 98.1°F | Resp 16 | Ht 66.0 in | Wt 150.0 lb

## 2018-10-31 DIAGNOSIS — Z1239 Encounter for other screening for malignant neoplasm of breast: Secondary | ICD-10-CM | POA: Diagnosis not present

## 2018-10-31 DIAGNOSIS — M1990 Unspecified osteoarthritis, unspecified site: Secondary | ICD-10-CM

## 2018-10-31 DIAGNOSIS — M170 Bilateral primary osteoarthritis of knee: Secondary | ICD-10-CM | POA: Diagnosis not present

## 2018-10-31 DIAGNOSIS — K219 Gastro-esophageal reflux disease without esophagitis: Secondary | ICD-10-CM | POA: Diagnosis not present

## 2018-10-31 DIAGNOSIS — E7800 Pure hypercholesterolemia, unspecified: Secondary | ICD-10-CM | POA: Diagnosis not present

## 2018-10-31 DIAGNOSIS — Z1231 Encounter for screening mammogram for malignant neoplasm of breast: Secondary | ICD-10-CM

## 2018-10-31 DIAGNOSIS — R7309 Other abnormal glucose: Secondary | ICD-10-CM

## 2018-10-31 DIAGNOSIS — Z0000 Encounter for general adult medical examination without abnormal findings: Secondary | ICD-10-CM | POA: Diagnosis not present

## 2018-10-31 MED ORDER — PANTOPRAZOLE SODIUM 40 MG PO TBEC: 40.0000 mg | DELAYED_RELEASE_TABLET | Freq: Every day | ORAL | 3 refills | Status: DC

## 2018-10-31 MED ORDER — IBUPROFEN 800 MG PO TABS: 800.0000 mg | ORAL_TABLET | Freq: Three times a day (TID) | ORAL | 5 refills | Status: AC | PRN

## 2018-10-31 NOTE — Progress Notes (Signed)
Subjective:   Megan Cervantes is a 73 y.o. female who presents for Medicare Annual (Subsequent) preventive examination.    This visit is being conducted through telemedicine due to the COVID-19 pandemic. This patient has given me verbal consent via doximity to conduct this visit, patient states they are participating from their home address. Some vital signs may be absent or patient reported.    Patient identification: identified by name, DOB, and current address  Review of Systems:  N/A  Cardiac Risk Factors include: advanced age (>69men, >51 women)     Objective:     Vitals: There were no vitals taken for this visit.  There is no height or weight on file to calculate BMI. Unable to obtain vitals due to visit being conducted via telephonically.   Advanced Directives 10/31/2018 08/15/2017 11/04/2016  Does Patient Have a Medical Advance Directive? Yes Yes Yes  Type of Paramedic of [place];Living will Living will Living will  Copy of [place] in Chart? No - copy requested - -    Tobacco Social History   Tobacco Use  Smoking Status Never Smoker  Smokeless Tobacco Never Used     Counseling given: Not Answered   Clinical Intake:     Pain Score: 0-No pain     Diabetes: No  How often do you need to have someone help you when you read instructions, pamphlets, or other written materials from your doctor or pharmacy?: 1 - Never  Interpreter Needed?: No  Information entered by :: Mmarkoski, LPN  Past Medical History:  Diagnosis Date   Hyperlipidemia    Past Surgical History:  Procedure Laterality Date   ABDOMINAL HYSTERECTOMY  1986   total   APPENDECTOMY     BIOPSY THYROID Bilateral    BREAST BIOPSY Left    90's   CHOLECYSTECTOMY  90's   diverticulisitis  2016   removed about a foot of colon   EYE SURGERY Bilateral 2010   cataract   HERNIA REPAIR  11/2015   mesh covers large portion of stomach    History reviewed. No pertinent family history. 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  Social Designer, fashion/clothing strain: Not hard at [employer] insecurity    Worry: Never true    Inability: Never true   Transportation needs    Medical: No    Non-medical: No  Tobacco Use   Smoking status: Never Smoker   Smokeless tobacco: Never Used  Substance and Sexual Activity   Alcohol use: No   Drug use: No   Sexual activity: Not on file  Lifestyle   Physical activity    Days per week: 0 days    Minutes per session: 0 min   Stress: Not at all  Relationships   Social connections    Talks on phone: Patient refused    Gets together: Patient refused    Attends religious service: Patient refused    Active member of club or organization: Patient refused    Attends meetings of clubs or organizations: Patient refused    Relationship status: Patient refused  Other Topics Concern   Not on file  Social History Narrative   Not on file    Outpatient Encounter Medications as of 10/31/2018  Medication Sig   BIOTIN PO Take by mouth daily.    Cholecalciferol (VITAMIN D) 2000 units CAPS Take 1 capsule by mouth daily.   CRANBERRY CONCENTRATE PO Take by mouth daily.    esomeprazole (NEXIUM) 40 MG capsule TAKE 1 CAPSULE BY MOUTH ONCE DAILY   ibuprofen (ADVIL,MOTRIN) 800 MG tablet Take 1 tablet (800 mg total) by mouth every 8 (eight) hours as needed.   Misc Natural Products (GLUCOSAMINE CHOND COMPLEX/MSM PO) Take 1 tablet by mouth daily.   Multiple Vitamins-Minerals (CENTRUM SILVER PO) Take 1 tablet by mouth daily.   polyethylene glycol powder (GLYCOLAX/MIRALAX) powder Take 17 g by mouth 2 (two) times daily as  needed.   pseudoephedrine-acetaminophen (TYLENOL SINUS) 30-500 MG TABS tablet Take 1 tablet by mouth every 4 (four) hours as needed.   TURMERIC PO Take by mouth daily.    vitamin B-12 (CYANOCOBALAMIN) 1000 MCG tablet Take 1,000 mcg by mouth daily.   vitamin C (ASCORBIC ACID) 250 MG tablet Take 250 mg by mouth daily.   cephALEXin (KEFLEX) 500 MG capsule Take 1 capsule (500 mg total) by mouth 2 (two) times daily. (Patient not taking: Reported on 10/31/2018)   meloxicam (MOBIC) 7.5 MG tablet Take 1 tablet (7.5 mg total) by mouth daily. (Patient not taking: Reported on 02/19/2018)   No facility-administered encounter medications on file as of 10/31/2018.     Activities of Daily Living In your present state of health, do you have any difficulty performing the following activities: 10/31/2018  Hearing? N  Vision? N  Difficulty concentrating or making decisions? N  Walking or climbing stairs? Y  Comment Due to knee pains.  Dressing or bathing? N  Doing errands, shopping? N  Preparing Food and eating ? N  Using the Toilet? N  In the past six months, have you accidently leaked urine? N  Do you have problems with loss of bowel control? N  Managing your Medications? N  Managing your Finances? N  Housekeeping or managing your Housekeeping? N  Some recent data might be hidden    Patient Care Team: Mar Daring, PA-C as PCP - General (Family Medicine) Estill Cotta, MD as Consulting Physician (Ophthalmology)    Assessment:   This is a routine wellness examination for Megan Cervantes.  Exercise Activities and Dietary recommendations Current Exercise Habits: The patient does not participate in regular exercise at present, Exercise limited by: None identified  Goals     DIET - INCREASE WATER INTAKE     Recommend increasing water intake to 3-4 glasses a day.        Fall Risk: Fall Risk  10/31/2018 08/15/2017 07/19/2016  Falls in the past year? 0 No No    FALL RISK PREVENTION  PERTAINING TO THE HOME:  Any stairs in or around the home? No  If so, are there any without handrails? N/A  Home free of loose throw rugs in walkways, pet beds, electrical cords, etc? No  Adequate lighting in your home to reduce risk of falls? No   ASSISTIVE DEVICES UTILIZED TO PREVENT FALLS:  Life alert? No  Use of a cane, walker or w/c? No  Grab bars in the bathroom? Yes  Shower chair or bench in shower? Yes  Elevated toilet seat or a handicapped toilet? No    TIMED UP AND GO:  Was the test performed? No .    Depression Screen [facility] 2/9  Scores 10/31/2018 08/15/2017 08/15/2017 07/19/2016  PHQ - 2 Score 0 0 0 0  PHQ- 9 Score - 0 - 0     Cognitive Function:      6CIT Screen 10/31/2018 08/15/2017  What Year? 0 points 0 points  What month? 0 points 0 points  What time? 0 points 0 points  Count back from 20 0 points 0 points  Months in reverse 0 points 0 points  Repeat phrase 0 points 4 points  Total Score 0 4    Immunization History  Administered Date(s) Administered   Hepatitis A 02/14/2013   Influenza, High Dose Seasonal PF 03/19/2018   Influenza-Unspecified 02/15/2017   Pneumococcal Conjugate-13 11/05/2013   Pneumococcal Polysaccharide-23 05/07/2012   Td 05/07/2012   Tdap 05/22/2015   Zoster 05/22/2015    Qualifies for Shingles Vaccine? Yes  Zostavax completed 05/22/15. Due for Shingrix. Education has been provided regarding the importance of this vaccine. Pt has been advised to call insurance company to determine out of pocket expense. Advised may also receive vaccine at local pharmacy or Health Dept. Verbalized acceptance and understanding.  Tdap: Up to date  Flu Vaccine: Up to date  Pneumococcal Vaccine: Completed series  Screening Tests Health Maintenance  Topic Date Due   DEXA SCAN  03/09/2012   MAMMOGRAM  09/14/2018   INFLUENZA VACCINE  12/08/2018   COLONOSCOPY  07/23/2024   TETANUS/TDAP  05/21/2025   Hepatitis C Screening  Completed    PNA vac Low Risk Adult  Completed    Cancer Screenings:  Colorectal Screening: Completed 07/24/14. Repeat every 10 years.  Mammogram: Completed 09/23/16. Ordered today. Pt provided with contact info and advised to call to schedule appt. Pt aware the office will call re: appt.  Bone Density: Pt declined an order for this today.   Lung Cancer Screening: (Low Dose CT Chest recommended if Age 67-80 years, 30 pack-year currently smoking OR have quit w/in 15years.) does not qualify.   Additional Screening:  Hepatitis C Screening: Up to date  Vision Screening: Recommended annual ophthalmology exams for early detection of glaucoma and other disorders of the eye.  Dental Screening: Recommended annual dental exams for proper oral hygiene  Community Resource Referral:  CRR required this visit?  No       Plan:  I have personally reviewed and addressed the Medicare Annual Wellness questionnaire and have noted the following in the patients chart:  A. Medical and social history B. Use of alcohol, tobacco or illicit drugs  C. Current medications and supplements D. Functional ability and status E.  Nutritional status F.  Physical activity G. Advance directives H. List of other physicians I.  Hospitalizations, surgeries, and ER visits in previous 12 months J.  [place] such as hearing and vision if needed, cognitive and depression L. Referrals and appointments   In addition, I have reviewed and discussed with patient certain preventive protocols, quality metrics, and best practice recommendations. A written personalized care plan for preventive services as well as general preventive health recommendations were provided to patient. Nurse Health Advisor  Signed,    Camile Esters [place], [place]  [id number]/[id number] Nurse Health Advisor   Nurse Notes: Pt declined a DEXA order today.

## 2018-10-31 NOTE — Patient Instructions (Signed)
Health Maintenance After Age 72 After age 72, you are at a higher risk for certain long-term diseases and infections as well as injuries from falls. Falls are a major cause of broken bones and head injuries in people who are older than age 72. Getting regular preventive care can help to keep you healthy and well. Preventive care includes getting regular testing and making lifestyle changes as recommended by your health care provider. Talk with your health care provider about:  Which screenings and tests you should have. A screening is a test that checks for a disease when you have no symptoms.  A diet and exercise plan that is right for you. What should I know about screenings and tests to prevent falls? Screening and testing are the best ways to find a health problem early. Early diagnosis and treatment give you the best chance of managing medical conditions that are common after age 72. Certain conditions and lifestyle choices may make you more likely to have a fall. Your health care provider may recommend:  Regular vision checks. Poor vision and conditions such as cataracts can make you more likely to have a fall. If you wear glasses, make sure to get your prescription updated if your vision changes.  Medicine review. Work with your health care provider to regularly review all of the medicines you are taking, including over-the-counter medicines. Ask your health care provider about any side effects that may make you more likely to have a fall. Tell your health care provider if any medicines that you take make you feel dizzy or sleepy.  Osteoporosis screening. Osteoporosis is a condition that causes the bones to get weaker. This can make the bones weak and cause them to break more easily.  Blood pressure screening. Blood pressure changes and medicines to control blood pressure can make you feel dizzy.  Strength and balance checks. Your health care provider may recommend certain tests to check your  strength and balance while standing, walking, or changing positions.  Foot health exam. Foot pain and numbness, as well as not wearing proper footwear, can make you more likely to have a fall.  Depression screening. You may be more likely to have a fall if you have a fear of falling, feel emotionally low, or feel unable to do activities that you used to do.  Alcohol use screening. Using too much alcohol can affect your balance and may make you more likely to have a fall. What actions can I take to lower my risk of falls? General instructions  Talk with your health care provider about your risks for falling. Tell your health care provider if: ? You fall. Be sure to tell your health care provider about all falls, even ones that seem minor. ? You feel dizzy, sleepy, or off-balance.  Take over-the-counter and prescription medicines only as told by your health care provider. These include any supplements.  Eat a healthy diet and maintain a healthy weight. A healthy diet includes low-fat dairy products, low-fat (lean) meats, and fiber from whole grains, beans, and lots of fruits and vegetables. Home safety  Remove any tripping hazards, such as rugs, cords, and clutter.  Install safety equipment such as grab bars in bathrooms and safety rails on stairs.  Keep rooms and walkways well-lit. Activity   Follow a regular exercise program to stay fit. This will help you maintain your balance. Ask your health care provider what types of exercise are appropriate for you.  If you need a cane or   walker, use it as recommended by your health care provider.  Wear supportive shoes that have nonskid soles. Lifestyle  Do not drink alcohol if your health care provider tells you not to drink.  If you drink alcohol, limit how much you have: ? 0-1 drink a day for women. ? 0-2 drinks a day for men.  Be aware of how much alcohol is in your drink. In the [place] one drink equals one typical bottle of beer (12  oz), one-half glass of wine (5 oz), or one shot of hard liquor (1 oz).  Do not use any products that contain nicotine or tobacco, such as cigarettes and e-cigarettes. If you need help quitting, ask your health care provider. Summary  Having a healthy lifestyle and getting preventive care can help to protect your health and wellness after age 72.  Screening and testing are the best way to find a health problem early and help you avoid having a fall. Early diagnosis and treatment give you the best chance for managing medical conditions that are more common for people who are older than age 72.  Falls are a major cause of broken bones and head injuries in people who are older than age 72. Take precautions to prevent a fall at home.  Work with your health care provider to learn what changes you can make to improve your health and wellness and to prevent falls. This information is not intended to replace advice given to you by your health care provider. Make sure you discuss any questions you have with your health care provider. Document Released: 03/08/2017 Document Revised: 03/08/2017 Document Reviewed: 03/08/2017 Elsevier Interactive Patient Education  2019 [employer] Inc[employer]  

## 2018-10-31 NOTE — Patient Instructions (Signed)
[name] , Thank you for taking time to come for your Medicare Wellness Visit. I appreciate your ongoing commitment to your health goals. Please review the following plan we discussed and let me know if I can assist you in the future.   Screening recommendations/referrals: Colonoscopy: Up to date, due 07/2024 Mammogram: Ordered today. Pt provided with contact info and advised to call to schedule appt. Pt aware the office will call re: appt. Bone Density: Pt declines order today.  Recommended yearly ophthalmology/optometry visit for glaucoma screening and checkup Recommended yearly dental visit for hygiene and checkup  Vaccinations: Influenza vaccine: Up to date Pneumococcal vaccine: Completed series Tdap vaccine: Up to date, due 05/2025 Shingles vaccine: Pt declines today.     Advanced directives: Please bring a copy of your POA (Power of Attorney) and/or Living Will to your next appointment.   Conditions/risks identified: Continue to increase water intake to 6-8 8 oz glasses a day.   Next appointment: 3:40 PM today with Megan Cervantes. Pt declined scheduling CPE and AWV for 2021 at this time.    Preventive Care 14 Years and Older, Female Preventive care refers to lifestyle choices and visits with your health care provider that can promote health and wellness. What does preventive care include?  A yearly physical exam. This is also called an annual well check.  Dental exams once or twice a year.  Routine eye exams. Ask your health care provider how often you should have your eyes checked.  Personal lifestyle choices, including:  Daily care of your teeth and gums.  Regular physical activity.  Eating a healthy diet.  Avoiding tobacco and drug use.  Limiting alcohol use.  Practicing safe sex.  Taking low-dose aspirin every day.  Taking vitamin and mineral supplements as recommended by your health care provider. What happens during an annual well check? The  services and screenings done by your health care provider during your annual well check will depend on your age, overall health, lifestyle risk factors, and family history of disease. Counseling  Your health care provider may ask you questions about your:  Alcohol use.  Tobacco use.  Drug use.  Emotional well-being.  Home and relationship well-being.  Sexual activity.  Eating habits.  History of falls.  Memory and ability to understand (cognition).  Work and work Statistician.  Reproductive health. Screening  You may have the following tests or measurements:  Height, weight, and BMI.  Blood pressure.  Lipid and cholesterol levels. These may be checked every 5 years, or more frequently if you are over 91 years old.  Skin check.  Lung cancer screening. You may have this screening every year starting at age 61 if you have a 30-pack-year history of smoking and currently smoke or have quit within the past 15 years.  Fecal occult blood test (FOBT) of the stool. You may have this test every year starting at age 55.  Flexible sigmoidoscopy or colonoscopy. You may have a sigmoidoscopy every 5 years or a colonoscopy every 10 years starting at age 36.  Hepatitis C blood test.  Hepatitis B blood test.  Sexually transmitted disease (STD) testing.  Diabetes screening. This is done by checking your blood sugar (glucose) after you have not eaten for a while (fasting). You may have this done every 1-3 years.  Bone density scan. This is done to screen for osteoporosis. You may have this done starting at age 46.  Mammogram. This may be done every 1-2 years. Talk to your health  care provider about how often you should have regular mammograms. Talk with your health care provider about your test results, treatment options, and if necessary, the need for more tests. Vaccines  Your health care provider may recommend certain vaccines, such as:  Influenza vaccine. This is recommended  every year.  Tetanus, diphtheria, and acellular pertussis (Tdap, Td) vaccine. You may need a Td booster every 10 years.  Zoster vaccine. You may need this after age 21.  Pneumococcal 13-valent conjugate (PCV13) vaccine. One dose is recommended after age 68.  Pneumococcal polysaccharide (PPSV23) vaccine. One dose is recommended after age 46. Talk to your health care provider about which screenings and vaccines you need and how often you need them. This information is not intended to replace advice given to you by your health care provider. Make sure you discuss any questions you have with your health care provider. Document Released: 05/22/2015 Document Revised: 01/13/2016 Document Reviewed: 02/24/2015 Elsevier Interactive Patient Education  2017 [place] Prevention in the Home Falls can cause injuries. They can happen to people of all ages. There are many things you can do to make your home safe and to help prevent falls. What can I do on the outside of my home?  Regularly fix the edges of walkways and driveways and fix any cracks.  Remove anything that might make you trip as you walk through a door, such as a raised step or threshold.  Trim any bushes or trees on the path to your home.  Use bright outdoor lighting.  Clear any walking paths of anything that might make someone trip, such as rocks or tools.  Regularly check to see if handrails are loose or broken. Make sure that both sides of any steps have handrails.  Any raised decks and porches should have guardrails on the edges.  Have any leaves, snow, or ice cleared regularly.  Use sand or salt on walking paths during winter.  Clean up any spills in your garage right away. This includes oil or grease spills. What can I do in the bathroom?  Use night lights.  Install grab bars by the toilet and in the tub and shower. Do not use towel bars as grab bars.  Use non-skid mats or decals in the tub or shower.  If  you need to sit down in the shower, use a plastic, non-slip stool.  Keep the floor dry. Clean up any water that spills on the floor as soon as it happens.  Remove soap buildup in the tub or shower regularly.  Attach bath mats securely with double-sided non-slip rug tape.  Do not have throw rugs and other things on the floor that can make you trip. What can I do in the bedroom?  Use night lights.  Make sure that you have a light by your bed that is easy to reach.  Do not use any sheets or blankets that are too big for your bed. They should not hang down onto the floor.  Have a firm chair that has side arms. You can use this for support while you get dressed.  Do not have throw rugs and other things on the floor that can make you trip. What can I do in the kitchen?  Clean up any spills right away.  Avoid walking on wet floors.  Keep items that you use a lot in easy-to-reach places.  If you need to reach something above you, use a strong step stool that has a grab  bar.  Keep electrical cords out of the way.  Do not use floor polish or wax that makes floors slippery. If you must use wax, use non-skid floor wax.  Do not have throw rugs and other things on the floor that can make you trip. What can I do with my stairs?  Do not leave any items on the stairs.  Make sure that there are handrails on both sides of the stairs and use them. Fix handrails that are broken or loose. Make sure that handrails are as long as the stairways.  Check any carpeting to make sure that it is firmly attached to the stairs. Fix any carpet that is loose or worn.  Avoid having throw rugs at the top or bottom of the stairs. If you do have throw rugs, attach them to the floor with carpet tape.  Make sure that you have a light switch at the top of the stairs and the bottom of the stairs. If you do not have them, ask someone to add them for you. What else can I do to help prevent falls?  Wear shoes  that:  Do not have high heels.  Have rubber bottoms.  Are comfortable and fit you well.  Are closed at the toe. Do not wear sandals.  If you use a stepladder:  Make sure that it is fully opened. Do not climb a closed stepladder.  Make sure that both sides of the stepladder are locked into place.  Ask someone to hold it for you, if possible.  Clearly mark and make sure that you can see:  Any grab bars or handrails.  First and last steps.  Where the edge of each step is.  Use tools that help you move around (mobility aids) if they are needed. These include:  Canes.  Walkers.  Scooters.  Crutches.  Turn on the lights when you go into a dark area. Replace any light bulbs as soon as they burn out.  Set up your furniture so you have a clear path. Avoid moving your furniture around.  If any of your floors are uneven, fix them.  If there are any pets around you, be aware of where they are.  Review your medicines with your doctor. Some medicines can make you feel dizzy. This can increase your chance of falling. Ask your doctor what other things that you can do to help prevent falls. This information is not intended to replace advice given to you by your health care provider. Make sure you discuss any questions you have with your health care provider. Document Released: 02/19/2009 Document Revised: 10/01/2015 Document Reviewed: 05/30/2014 Elsevier Interactive Patient Education  2017 [employer].

## 2018-11-06 DIAGNOSIS — M170 Bilateral primary osteoarthritis of knee: Secondary | ICD-10-CM | POA: Diagnosis not present

## 2018-11-06 DIAGNOSIS — K219 Gastro-esophageal reflux disease without esophagitis: Secondary | ICD-10-CM | POA: Diagnosis not present

## 2018-11-06 DIAGNOSIS — R7309 Other abnormal glucose: Secondary | ICD-10-CM | POA: Diagnosis not present

## 2018-11-06 DIAGNOSIS — E7800 Pure hypercholesterolemia, unspecified: Secondary | ICD-10-CM | POA: Diagnosis not present

## 2018-11-07 ENCOUNTER — Telehealth: Payer: Self-pay

## 2018-11-07 DIAGNOSIS — K219 Gastro-esophageal reflux disease without esophagitis: Secondary | ICD-10-CM

## 2018-11-07 LAB — CBC WITH DIFFERENTIAL/PLATELET
Basophils Absolute: 0 10*3/uL (ref 0.0–0.2)
Basos: 1 %
EOS (ABSOLUTE): 0.1 10*3/uL (ref 0.0–0.4)
Eos: 2 %
Hematocrit: 37.9 % (ref 34.0–46.6)
Hemoglobin: 12.7 g/dL (ref 11.1–15.9)
Immature Grans (Abs): 0 10*3/uL (ref 0.0–0.1)
Immature Granulocytes: 0 %
Lymphocytes Absolute: 1.5 10*3/uL (ref 0.7–3.1)
Lymphs: 30 %
MCH: 33.9 pg — ABNORMAL HIGH (ref 26.6–33.0)
MCHC: 33.5 g/dL (ref 31.5–35.7)
MCV: 101 fL — ABNORMAL HIGH (ref 79–97)
Monocytes Absolute: 0.5 10*3/uL (ref 0.1–0.9)
Monocytes: 9 %
Neutrophils Absolute: 2.9 10*3/uL (ref 1.4–7.0)
Neutrophils: 58 %
Platelets: 245 10*3/uL (ref 150–450)
RBC: 3.75 x10E6/uL — ABNORMAL LOW (ref 3.77–5.28)
RDW: 12.8 % (ref 11.7–15.4)
WBC: 5 10*3/uL (ref 3.4–10.8)

## 2018-11-07 LAB — COMPREHENSIVE METABOLIC PANEL
ALT: 26 IU/L (ref 0–32)
AST: 34 IU/L (ref 0–40)
Albumin/Globulin Ratio: 2 (ref 1.2–2.2)
Albumin: 4.5 g/dL (ref 3.7–4.7)
Alkaline Phosphatase: 89 IU/L (ref 39–117)
BUN/Creatinine Ratio: 23 (ref 12–28)
BUN: 17 mg/dL (ref 8–27)
Bilirubin Total: 0.2 mg/dL (ref 0.0–1.2)
CO2: 20 mmol/L (ref 20–29)
Calcium: 9.5 mg/dL (ref 8.7–10.3)
Chloride: 100 mmol/L (ref 96–106)
Creatinine, Ser: 0.74 mg/dL (ref 0.57–1.00)
GFR calc Af Amer: 94 mL/min/{1.73_m2} (ref >59)
GFR calc non Af Amer: 82 mL/min/{1.73_m2} (ref >59)
Globulin, Total: 2.3 g/dL (ref 1.5–4.5)
Glucose: 117 mg/dL — ABNORMAL HIGH (ref 65–99)
Potassium: 5.7 mmol/L — ABNORMAL HIGH (ref 3.5–5.2)
Sodium: 139 mmol/L (ref 134–144)
Total Protein: 6.8 g/dL (ref 6.0–8.5)

## 2018-11-07 LAB — HEMOGLOBIN A1C
Est. average glucose Bld gHb Est-mCnc: 128 mg/dL
Hgb A1c MFr Bld: 6.1 % — ABNORMAL HIGH (ref 4.8–5.6)

## 2018-11-07 LAB — LIPID PANEL
Chol/HDL Ratio: 3.7 ratio (ref 0.0–4.4)
Cholesterol, Total: 201 mg/dL — ABNORMAL HIGH (ref 100–199)
HDL: 55 mg/dL (ref >39)
LDL Calculated: 99 mg/dL (ref 0–99)
Triglycerides: 235 mg/dL — ABNORMAL HIGH (ref 0–149)
VLDL Cholesterol Cal: 47 mg/dL — ABNORMAL HIGH (ref 5–40)

## 2018-11-07 MED ORDER — OMEPRAZOLE 40 MG PO CPDR: 40.0000 mg | DELAYED_RELEASE_CAPSULE | Freq: Every day | ORAL | 1 refills | Status: DC

## 2018-11-07 NOTE — Telephone Encounter (Signed)
-----   Message from Mar Daring, PA-C sent at 11/07/2018  8:38 AM EDT ----- Blood count is stable. Kidney and liver function are normal. Potassium is falsely elevated due to the sample being hemolyzed. If desired we can recheck potassium only for accurate reading. Cholesterol is stable and slightly improved from last year. A1c/sugar is up slightly from last year to 6.1 from 5.9. Continue working on healthy lifestyle modifications.

## 2018-11-07 NOTE — Telephone Encounter (Signed)
Patient advised as directed below. Patient also wants you to know that Pantoprazole cause her to have diarrhea and she stop it and for the moment she went back to taking the Nexium. She is asking if you can prescribed Omeprazole.

## 2018-11-07 NOTE — Telephone Encounter (Signed)
Omeprazole sent in   

## 2018-11-28 DIAGNOSIS — Z09 Encounter for follow-up examination after completed treatment for conditions other than malignant neoplasm: Secondary | ICD-10-CM | POA: Diagnosis not present

## 2018-11-28 DIAGNOSIS — D2261 Melanocytic nevi of right upper limb, including shoulder: Secondary | ICD-10-CM | POA: Diagnosis not present

## 2018-11-28 DIAGNOSIS — D0439 Carcinoma in situ of skin of other parts of face: Secondary | ICD-10-CM | POA: Diagnosis not present

## 2018-11-28 DIAGNOSIS — Z872 Personal history of diseases of the skin and subcutaneous tissue: Secondary | ICD-10-CM | POA: Diagnosis not present

## 2018-11-28 DIAGNOSIS — D485 Neoplasm of uncertain behavior of skin: Secondary | ICD-10-CM | POA: Diagnosis not present

## 2018-11-28 DIAGNOSIS — L821 Other seborrheic keratosis: Secondary | ICD-10-CM | POA: Diagnosis not present

## 2018-11-28 DIAGNOSIS — D225 Melanocytic nevi of trunk: Secondary | ICD-10-CM | POA: Diagnosis not present

## 2018-11-28 DIAGNOSIS — D2262 Melanocytic nevi of left upper limb, including shoulder: Secondary | ICD-10-CM | POA: Diagnosis not present

## 2018-12-06 ENCOUNTER — Other Ambulatory Visit: Payer: Self-pay

## 2018-12-12 NOTE — Progress Notes (Signed)
Patient: Megan Cervantes Female    DOB: November 18, 1945   73 y.o.   MRN: [id number] Visit Date: 12/13/2018  Today's Provider: Mar Daring, PA-C   Chief Complaint  Patient presents with  . Leg cramping   Subjective:     Virtual Visit via Telephone Note  I connected with Megan Cervantes on 12/13/18 at  8:20 AM EDT by telephone and verified that I am speaking with the correct person using two identifiers.  Location: Patient: Home Provider: [facility]   I discussed the limitations, risks, security and privacy concerns of performing an evaluation and management service by telephone and the availability of in person appointments. I also discussed with the patient that there may be a patient responsible charge related to this service. The patient expressed understanding and agreed to proceed.   HPI  Patient c/o leg cramps for month. She reports that it happens mostly at night specially if she gets up during the night. This has been happening for over one month. She felt she may have been dehydrated when it first started so she started drinking more water and eating a banana almost daily for extra potassium. Does not happen nightly, but when it does it always occurs as she moves to get out of bed to go to the bathroom. Then the next day it makes her legs feel weak and shaky.   She c/o ulcers/spots on the top of her mouth.She reports this happens seasonal and she usually gets a mouth wash prescribed.  Allergies  Allergen Reactions  . Azithromycin Diarrhea and Nausea Only  . Bactrim [Sulfamethoxazole-Trimethoprim] Nausea Only  . Penicillin G Rash     Current Outpatient Medications:  .  BIOTIN PO, Take by mouth daily. , Disp: , Rfl:  .  Cholecalciferol (VITAMIN D) 2000 units CAPS, Take 1 capsule by mouth daily., Disp: , Rfl:  .  CRANBERRY CONCENTRATE PO, Take by mouth daily. , Disp: , Rfl:  .  ibuprofen (ADVIL) 800 MG tablet, Take 1 tablet (800 mg total) by mouth every 8 (eight) hours  as needed., Disp: 30 tablet, Rfl: 5 .  Misc Natural Products (GLUCOSAMINE CHOND COMPLEX/MSM PO), Take 1 tablet by mouth daily., Disp: , Rfl:  .  Multiple Vitamins-Minerals (CENTRUM SILVER PO), Take 1 tablet by mouth daily., Disp: , Rfl:  .  omeprazole (PRILOSEC) 40 MG capsule, Take 1 capsule (40 mg total) by mouth daily., Disp: 90 capsule, Rfl: 1 .  polyethylene glycol powder (GLYCOLAX/MIRALAX) powder, Take 17 g by mouth 2 (two) times daily as needed., Disp: 3350 g, Rfl: 1 .  pseudoephedrine-acetaminophen (TYLENOL SINUS) 30-500 MG TABS tablet, Take 1 tablet by mouth every 4 (four) hours as needed., Disp: , Rfl:  .  TURMERIC PO, Take by mouth daily. , Disp: , Rfl:  .  vitamin B-12 (CYANOCOBALAMIN) 1000 MCG tablet, Take 1,000 mcg by mouth daily., Disp: , Rfl:  .  vitamin C (ASCORBIC ACID) 250 MG tablet, Take 250 mg by mouth daily., Disp: , Rfl:   Review of Systems  Constitutional: Negative.   Respiratory: Negative.   Cardiovascular: Negative.   Musculoskeletal: Positive for myalgias.  Neurological: Positive for weakness.    Social History   Tobacco Use  . Smoking status: Never Smoker  . Smokeless tobacco: Never Used  Substance Use Topics  . Alcohol use: No      Objective:   BP (!) 133/99 (BP Location: Left Arm, Patient Position: Sitting, Cuff Size: Large)   Wt 145  lb 11.2 oz (66.1 kg)   BMI 23.52 kg/m  Vitals:   12/13/18 0814  BP: (!) 133/99  Weight: 145 lb 11.2 oz (66.1 kg)     Physical Exam Vitals signs reviewed.  Constitutional:      General: She is not in acute distress. Pulmonary:     Effort: No respiratory distress.  Neurological:     Mental Status: She is alert.      No results found for any visits on 12/13/18.     Assessment & Plan     1. Nocturnal leg cramps Advised to try Magnesium 250mg  nightly x 2 weeks. Call if not improving.   2. Aphthous ulcer Had last month. Took pictures for her dentist and was sent in [employer]. That helped and  ulcers are healing.    I discussed the assessment and treatment plan with the patient. The patient was provided an opportunity to ask questions and all were answered. The patient agreed with the plan and demonstrated an understanding of the instructions.   The patient was advised to call back or seek an in-person evaluation if the symptoms worsen or if the condition fails to improve as anticipated.  I provided 14 minutes of non-face-to-face time during this encounter.     Mar Daring, PA-C  [facility]

## 2018-12-13 ENCOUNTER — Ambulatory Visit (INDEPENDENT_AMBULATORY_CARE_PROVIDER_SITE_OTHER): Payer: Medicare Other | Admitting: Physician Assistant

## 2018-12-13 ENCOUNTER — Other Ambulatory Visit: Payer: Self-pay

## 2018-12-13 ENCOUNTER — Encounter: Payer: Self-pay | Admitting: Physician Assistant

## 2018-12-13 VITALS — BP 133/99 | Wt 145.7 lb

## 2018-12-13 DIAGNOSIS — G4762 Sleep related leg cramps: Secondary | ICD-10-CM

## 2018-12-13 DIAGNOSIS — K120 Recurrent oral aphthae: Secondary | ICD-10-CM

## 2018-12-13 NOTE — Patient Instructions (Signed)
Magnesium sulfate 250mg  for cramps   Leg Cramps Leg cramps occur when one or more muscles tighten and you have no control over this tightening (involuntary muscle contraction). Muscle cramps can develop in any muscle, but the most common place is in the calf muscles of the leg. Those cramps can occur during exercise or when you are at rest. Leg cramps are painful, and they may last for a few seconds to a few minutes. Cramps may return several times before they finally stop. Usually, leg cramps are not caused by a serious medical problem. In many cases, the cause is not known. Some common causes include:  Excessive physical effort (overexertion), such as during intense exercise.  Overuse from repetitive motions, or doing the same thing over and over.  Staying in a certain position for a long period of time.  Improper preparation, form, or technique while performing a sport or an activity.  Dehydration.  Injury.  Side effects of certain medicines.  Abnormally low levels of minerals in your blood (electrolytes), especially potassium and calcium. This could result from: ? Pregnancy. ? Taking diuretic medicines. Follow these instructions at home: Eating and drinking  Drink enough fluid to keep your urine pale yellow. Staying hydrated may help prevent cramps.  Eat a healthy diet that includes plenty of nutrients to help your muscles function. A healthy diet includes fruits and vegetables, lean protein, whole grains, and low-fat or nonfat dairy products. Managing pain, stiffness, and swelling      Try massaging, stretching, and relaxing the affected muscle. Do this for several minutes at a time.  If directed, put ice on areas that are sore or painful after a cramp: ? Put ice in a plastic bag. ? Place a towel between your skin and the bag. ? Leave the ice on for 20 minutes, 2-3 times a day.  If directed, apply heat to muscles that are tense or tight. Do this before you exercise, or  as often as told by your health care provider. Use the heat source that your health care provider recommends, such as a moist heat pack or a heating pad. ? Place a towel between your skin and the heat source. ? Leave the heat on for 20-30 minutes. ? Remove the heat if your skin turns bright red. This is especially important if you are unable to feel pain, heat, or cold. You may have a greater risk of getting burned.  Try taking hot showers or baths to help relax tight muscles. General instructions  If you are having frequent leg cramps, avoid intense exercise for several days.  Take over-the-counter and prescription medicines only as told by your health care provider.  Keep all follow-up visits as told by your health care provider. This is important. Contact a health care provider if:  Your leg cramps get more severe or more frequent, or they do not improve over time.  Your foot becomes cold, numb, or blue. Summary  Muscle cramps can develop in any muscle, but the most common place is in the calf muscles of the leg.  Leg cramps are painful, and they may last for a few seconds to a few minutes.  Usually, leg cramps are not caused by a serious medical problem. Often, the cause is not known.  Stay hydrated and take over-the-counter and prescription medicines only as told by your health care provider. This information is not intended to replace advice given to you by your health care provider. Make sure you discuss any  questions you have with your health care provider. Document Released: 06/02/2004 Document Revised: 04/07/2017 Document Reviewed: 02/02/2017 Elsevier Patient Education  2020 [employer].

## 2018-12-17 ENCOUNTER — Telehealth: Payer: Self-pay | Admitting: Physician Assistant

## 2018-12-17 DIAGNOSIS — R252 Cramp and spasm: Secondary | ICD-10-CM

## 2018-12-17 MED ORDER — BACLOFEN 10 MG PO TABS: 10.0000 mg | ORAL_TABLET | Freq: Three times a day (TID) | ORAL | 0 refills | Status: DC

## 2018-12-17 NOTE — Telephone Encounter (Signed)
Patient was advised. Patient does not want to take a muscle relaxer. Patient believes she is dehydrated. Patient wanted to know it there is a test she can take to see if she is dehydrated? Please advise?

## 2018-12-17 NOTE — Telephone Encounter (Signed)
BMP ordered. She can come at her convenience.

## 2018-12-17 NOTE — Telephone Encounter (Signed)
Patient advised.

## 2018-12-17 NOTE — Telephone Encounter (Signed)
Pt is having cramps in hands now, was having it only in her legs up to 4 times a night, lasting up to 20 minutes.  Pt feels so weak in the legs the next day.  Pt wanting to know what Megan Cervantes suggests.  Please advise.  Thanks, [employer]

## 2018-12-17 NOTE — Addendum Note (Signed)
Addended by: Mar Daring on: 12/17/2018 10:17 AM   Modules accepted: Orders

## 2018-12-17 NOTE — Telephone Encounter (Signed)
Sent in low dose muscle relaxer to try

## 2018-12-17 NOTE — Telephone Encounter (Signed)
Please advised  

## 2018-12-18 ENCOUNTER — Telehealth: Payer: Self-pay

## 2018-12-18 LAB — BASIC METABOLIC PANEL
BUN/Creatinine Ratio: 22 (ref 12–28)
BUN: 19 mg/dL (ref 8–27)
CO2: 23 mmol/L (ref 20–29)
Calcium: 10.2 mg/dL (ref 8.7–10.3)
Chloride: 100 mmol/L (ref 96–106)
Creatinine, Ser: 0.86 mg/dL (ref 0.57–1.00)
GFR calc Af Amer: 79 mL/min/{1.73_m2} (ref >59)
GFR calc non Af Amer: 68 mL/min/{1.73_m2} (ref >59)
Glucose: 85 mg/dL (ref 65–99)
Potassium: 4.8 mmol/L (ref 3.5–5.2)
Sodium: 141 mmol/L (ref 134–144)

## 2018-12-18 NOTE — Telephone Encounter (Signed)
Viewed by Leslie Andrea on 12/18/2018 1:18 PM

## 2018-12-18 NOTE — Telephone Encounter (Signed)
-----   Message from Mar Daring, [place] sent at 12/18/2018  1:10 PM EDT ----- Sodium, potassium and calcium are all normal. Kidney function is normal range as well. Would recommend magnesium sulfate 250mg  for cramps and muscle relaxer as needed for severe/continuous cramping

## 2018-12-19 ENCOUNTER — Telehealth: Payer: Self-pay | Admitting: Physician Assistant

## 2018-12-19 DIAGNOSIS — R252 Cramp and spasm: Secondary | ICD-10-CM

## 2018-12-19 NOTE — Telephone Encounter (Signed)
Pt would like to speak to a nurse about her test results from Monday.  She got her test results but has some questions  CB#  [phone]  Con Memos

## 2018-12-19 NOTE — Telephone Encounter (Signed)
Call can not be completed at this time.  °

## 2018-12-20 NOTE — Telephone Encounter (Signed)
We can check an autoimmune panel to make sure nothing else is going on. She can come at her convenience for lab.  Also I spoke with Dr. Rosanna Randy and he agrees with continuing magnesium at bedtime and he mentioned adding tonic water at bedtime as well. He recommends 8 oz of tonic water at bedtime x 1 week then cut back to a half bottle. Tonic water has quinine which can help muscle cramps.

## 2018-12-20 NOTE — Telephone Encounter (Signed)
Patient reports that she doesn't want to do the muscle relaxer and that if there's anything else that she can do since her labs can back normal and she is not dehydrated.She is asking if she can go to someone else that may know the cause of her cramping? Reports that they are radiating to her legs and also has them on her hands.

## 2018-12-20 NOTE — Telephone Encounter (Signed)
Patient was advised. Expressed understanding.  

## 2018-12-21 DIAGNOSIS — R760 Raised antibody titer: Secondary | ICD-10-CM | POA: Diagnosis not present

## 2018-12-21 DIAGNOSIS — R252 Cramp and spasm: Secondary | ICD-10-CM | POA: Diagnosis not present

## 2018-12-25 LAB — ANA,IFA RA DIAG PNL W/RFLX TIT/PATN
ANA Titer 1: NEGATIVE
Cyclic Citrullin Peptide Ab: 43 units — ABNORMAL HIGH (ref 0–19)
Rheumatoid fact SerPl-aCnc: <10 IU/mL (ref 0.0–13.9)

## 2018-12-26 ENCOUNTER — Other Ambulatory Visit: Payer: Self-pay | Admitting: Physician Assistant

## 2018-12-26 DIAGNOSIS — R252 Cramp and spasm: Secondary | ICD-10-CM

## 2018-12-28 LAB — CK: Total CK: 75 U/L (ref 32–182)

## 2018-12-28 LAB — SPECIMEN STATUS REPORT

## 2018-12-31 ENCOUNTER — Telehealth: Payer: Self-pay | Admitting: Physician Assistant

## 2018-12-31 DIAGNOSIS — R252 Cramp and spasm: Secondary | ICD-10-CM

## 2018-12-31 MED ORDER — MAGNESIUM OXIDE 400 MG PO TABS: 400.0000 mg | ORAL_TABLET | Freq: Every day | ORAL | 1 refills | Status: DC

## 2018-12-31 MED ORDER — POTASSIUM CHLORIDE ER 10 MEQ PO TBCR: 10.0000 meq | EXTENDED_RELEASE_TABLET | Freq: Every day | ORAL | 1 refills | Status: DC

## 2018-12-31 NOTE — Telephone Encounter (Signed)
Spoke with patient, changed magnesium sulfate to magnesium oxide. Added potassium. Unable to tolerate muscle relaxers, nausea and daytime somnolence. Labs unremarkable except elevated CCP. Will refer to rheumatology.

## 2018-12-31 NOTE — Telephone Encounter (Signed)
Pt called saying she is having muscle cramps in her legs at night.  She can not take the muscle relaxer that she was prescribed.  She would like Jenni to call her back so she can explain what is going on and hopefully get something to help.  CB#  [phone]  Con Memos

## 2018-12-31 NOTE — Telephone Encounter (Signed)
Please advise 

## 2019-01-08 ENCOUNTER — Ambulatory Visit
Admission: RE | Admit: 2019-01-08 | Discharge: 2019-01-08 | Disposition: A | Discharge status: Home / Self Care | Payer: Medicare Other | Source: Ambulatory Visit | Attending: Physician Assistant | Admitting: Physician Assistant

## 2019-01-08 DIAGNOSIS — Z1231 Encounter for screening mammogram for malignant neoplasm of breast: Secondary | ICD-10-CM | POA: Insufficient documentation

## 2019-01-15 DIAGNOSIS — M159 Polyosteoarthritis, unspecified: Secondary | ICD-10-CM | POA: Diagnosis not present

## 2019-01-15 DIAGNOSIS — R252 Cramp and spasm: Secondary | ICD-10-CM | POA: Diagnosis not present

## 2019-01-15 DIAGNOSIS — M791 Myalgia, unspecified site: Secondary | ICD-10-CM | POA: Diagnosis not present

## 2019-01-15 DIAGNOSIS — D7589 Other specified diseases of blood and blood-forming organs: Secondary | ICD-10-CM | POA: Diagnosis not present

## 2019-01-15 DIAGNOSIS — R768 Other specified abnormal immunological findings in serum: Secondary | ICD-10-CM | POA: Diagnosis not present

## 2019-01-23 ENCOUNTER — Other Ambulatory Visit: Payer: Self-pay | Admitting: Physician Assistant

## 2019-01-23 DIAGNOSIS — R921 Mammographic calcification found on diagnostic imaging of breast: Secondary | ICD-10-CM

## 2019-01-23 DIAGNOSIS — R928 Other abnormal and inconclusive findings on diagnostic imaging of breast: Secondary | ICD-10-CM

## 2019-01-25 ENCOUNTER — Ambulatory Visit
Admission: RE | Admit: 2019-01-25 | Discharge: 2019-01-25 | Disposition: A | Discharge status: Home / Self Care | Payer: Medicare Other | Source: Ambulatory Visit | Attending: Physician Assistant | Admitting: Physician Assistant

## 2019-01-25 DIAGNOSIS — R921 Mammographic calcification found on diagnostic imaging of breast: Secondary | ICD-10-CM

## 2019-01-25 DIAGNOSIS — R928 Other abnormal and inconclusive findings on diagnostic imaging of breast: Secondary | ICD-10-CM

## 2019-01-25 DIAGNOSIS — R922 Inconclusive mammogram: Secondary | ICD-10-CM | POA: Diagnosis not present

## 2019-02-05 DIAGNOSIS — R252 Cramp and spasm: Secondary | ICD-10-CM | POA: Diagnosis not present

## 2019-02-05 DIAGNOSIS — M159 Polyosteoarthritis, unspecified: Secondary | ICD-10-CM | POA: Diagnosis not present

## 2019-02-05 DIAGNOSIS — R768 Other specified abnormal immunological findings in serum: Secondary | ICD-10-CM | POA: Diagnosis not present

## 2019-02-08 ENCOUNTER — Telehealth: Payer: Self-pay

## 2019-02-08 DIAGNOSIS — R3989 Other symptoms and signs involving the genitourinary system: Secondary | ICD-10-CM

## 2019-02-08 MED ORDER — CEPHALEXIN 500 MG PO CAPS: 500.0000 mg | ORAL_CAPSULE | Freq: Two times a day (BID) | ORAL | 0 refills | Status: DC

## 2019-02-08 MED ORDER — NITROFURANTOIN MONOHYD MACRO 100 MG PO CAPS: 100.0000 mg | ORAL_CAPSULE | Freq: Two times a day (BID) | ORAL | 0 refills | Status: DC

## 2019-02-08 NOTE — Telephone Encounter (Signed)
Received a fax from the call center patient called our office during our lunch hours. The Reason of her call: needing to schedule an appt, and asks for a call back. She is having UTI, burning,frequency, and pain. She is wondering what to do. Please Review.

## 2019-02-08 NOTE — Telephone Encounter (Signed)
Macrobid sent in for her

## 2019-02-08 NOTE — Telephone Encounter (Signed)
Patient reports that the Cephalexin and Cipro causes her leg cramps. If you can please send something else.

## 2019-02-08 NOTE — Telephone Encounter (Signed)
Keflex sent in to [facility].

## 2019-02-08 NOTE — Addendum Note (Signed)
Addended by: Mar Daring on: 02/08/2019 04:27 PM   Modules accepted: Orders

## 2019-02-08 NOTE — Telephone Encounter (Signed)
Pt called back saying she thinks she is getting a uti  CB#  [phone]  Con Memos

## 2019-02-14 DIAGNOSIS — D0439 Carcinoma in situ of skin of other parts of face: Secondary | ICD-10-CM | POA: Diagnosis not present

## 2019-02-25 DIAGNOSIS — Z23 Encounter for immunization: Secondary | ICD-10-CM | POA: Diagnosis not present

## 2019-04-27 ENCOUNTER — Other Ambulatory Visit: Payer: Self-pay | Admitting: Physician Assistant

## 2019-04-27 DIAGNOSIS — K219 Gastro-esophageal reflux disease without esophagitis: Secondary | ICD-10-CM

## 2019-06-03 ENCOUNTER — Telehealth: Payer: Self-pay

## 2019-06-03 DIAGNOSIS — R3989 Other symptoms and signs involving the genitourinary system: Secondary | ICD-10-CM

## 2019-06-03 MED ORDER — NITROFURANTOIN MONOHYD MACRO 100 MG PO CAPS: 100.0000 mg | ORAL_CAPSULE | Freq: Two times a day (BID) | ORAL | 0 refills | Status: DC

## 2019-06-03 NOTE — Telephone Encounter (Signed)
Patient calling back. She states she has not heard anything from office.

## 2019-06-03 NOTE — Telephone Encounter (Signed)
Copied from [place] [id number]. Topic: General - Inquiry >> Jun 03, 2019  9:09 AM Richardo Priest, NT wrote: Reason for CRM: Patient called in stating she is having another UTI and would like to have something called in if possible. Please advise.

## 2019-06-03 NOTE — Telephone Encounter (Signed)
Patient advised as directed below. 

## 2019-06-03 NOTE — Telephone Encounter (Signed)
Sent in Macrobid for her.

## 2019-06-03 NOTE — Addendum Note (Signed)
Addended by: Mar Daring on: 06/03/2019 03:00 PM   Modules accepted: Orders

## 2019-06-04 ENCOUNTER — Telehealth: Payer: Self-pay

## 2019-06-04 NOTE — Telephone Encounter (Signed)
Patient calling back in regards.

## 2019-06-04 NOTE — Telephone Encounter (Signed)
Copied from [place]. Topic: General - Other >> Jun 04, 2019 10:42 AM Rainey Pines A wrote: Patient would like a callback from [facility] in regards to medication for UTI causing leg cramps. Please advise

## 2019-06-05 NOTE — Telephone Encounter (Signed)
Patient was advised and states that she scheduled appointment with [facility] tomorrow, 06/06/2019.FYI

## 2019-06-05 NOTE — Telephone Encounter (Signed)
Please review for Jenni.   Thanks,   -Nicollette Wilhelmi  

## 2019-06-05 NOTE — Telephone Encounter (Signed)
Macrobid shouldn't be causing leg cramps. Seeing as she hasn't had a urine sample or culture she should probably schedule an office visit to get a sample and discuss her symptoms and potentially changing medications. Can be virtual or in office.

## 2019-06-06 ENCOUNTER — Encounter: Payer: Self-pay | Admitting: Physician Assistant

## 2019-06-06 ENCOUNTER — Ambulatory Visit (INDEPENDENT_AMBULATORY_CARE_PROVIDER_SITE_OTHER): Payer: Medicare Other | Admitting: Physician Assistant

## 2019-06-06 DIAGNOSIS — N3000 Acute cystitis without hematuria: Secondary | ICD-10-CM

## 2019-06-06 DIAGNOSIS — Z961 Presence of intraocular lens: Secondary | ICD-10-CM | POA: Diagnosis not present

## 2019-06-06 MED ORDER — AMOXICILLIN-POT CLAVULANATE 875-125 MG PO TABS: 1.0000 | ORAL_TABLET | Freq: Two times a day (BID) | ORAL | 0 refills | Status: DC

## 2019-06-06 NOTE — Progress Notes (Signed)
Patient: Megan Cervantes Female    DOB: [birth year]   74 y.o.   MRN: [id number] Visit Date: 06/06/2019  Today's Provider: Mar Daring, PA-C   Chief Complaint  Patient presents with  . Dysuria   Subjective:    Virtual Visit via Telephone Note  I connected with Megan Cervantes on 06/06/19 at  1:40 PM EST by telephone and verified that I am speaking with the correct person using two identifiers.  Location: Patient: Home Provider: [facility]   I discussed the limitations, risks, security and privacy concerns of performing an evaluation and management service by telephone and the availability of in person appointments. I also discussed with the patient that there may be a patient responsible charge related to this service. The patient expressed understanding and agreed to proceed.  Dysuria  This is a new problem. Episode onset: 6 days ago. The problem has been unchanged. The quality of the pain is described as burning. Associated symptoms include frequency. Pertinent negatives include no chills, nausea or vomiting. Treatments tried: AZO and one dose of Macrobid. The treatment provided mild relief.  Patient states the Macrobid caused her to have leg cramps, so she stopped taking them. She has also had leg cramps with cipro and keflex.   Allergies  Allergen Reactions  . Azithromycin Diarrhea and Nausea Only  . Bactrim [Sulfamethoxazole-Trimethoprim] Nausea Only  . Cephalexin Other (See Comments)    Muscle cramps  . Ciprofloxacin Other (See Comments)    Leg cramps  . Penicillin G Rash     Current Outpatient Medications:  .  BIOTIN PO, Take by mouth daily. , Disp: , Rfl:  .  Cholecalciferol (VITAMIN D) 2000 units CAPS, Take 1 capsule by mouth daily., Disp: , Rfl:  .  CRANBERRY CONCENTRATE PO, Take by mouth daily. , Disp: , Rfl:  .  ibuprofen (ADVIL) 800 MG tablet, Take 1 tablet (800 mg total) by mouth every 8 (eight) hours as needed., Disp: 30 tablet, Rfl: 5 .  magnesium  oxide (MAG-OX) 400 MG tablet, Take 1 tablet (400 mg total) by mouth daily., Disp: 90 tablet, Rfl: 1 .  Misc Natural Products (GLUCOSAMINE CHOND COMPLEX/MSM PO), Take 1 tablet by mouth daily., Disp: , Rfl:  .  Multiple Vitamins-Minerals (CENTRUM SILVER PO), Take 1 tablet by mouth daily., Disp: , Rfl:  .  omeprazole (PRILOSEC) 40 MG capsule, TAKE 1 CAPSULE(40 MG) BY MOUTH DAILY, Disp: 90 capsule, Rfl: 1 .  polyethylene glycol powder (GLYCOLAX/MIRALAX) powder, Take 17 g by mouth 2 (two) times daily as needed., Disp: 3350 g, Rfl: 1 .  potassium chloride (K-DUR) 10 MEQ tablet, Take 1 tablet (10 mEq total) by mouth at bedtime., Disp: 90 tablet, Rfl: 1 .  pseudoephedrine-acetaminophen (TYLENOL SINUS) 30-500 MG TABS tablet, Take 1 tablet by mouth every 4 (four) hours as needed., Disp: , Rfl:  .  TURMERIC PO, Take by mouth daily. , Disp: , Rfl:  .  vitamin B-12 (CYANOCOBALAMIN) 1000 MCG tablet, Take 1,000 mcg by mouth daily., Disp: , Rfl:  .  vitamin C (ASCORBIC ACID) 250 MG tablet, Take 250 mg by mouth daily., Disp: , Rfl:  .  nitrofurantoin, macrocrystal-monohydrate, (MACROBID) 100 MG capsule, Take 1 capsule (100 mg total) by mouth 2 (two) times daily. (Patient not taking: Reported on 06/06/2019), Disp: 20 capsule, Rfl: 0  Review of Systems  Constitutional: Negative for appetite change, chills, fatigue and fever.  Respiratory: Negative for chest tightness and shortness of breath.   Cardiovascular:  Negative for chest pain and palpitations.  Gastrointestinal: Negative for abdominal pain, nausea and vomiting.  Genitourinary: Positive for dysuria and frequency.  Neurological: Negative for dizziness and weakness.    Social History   Tobacco Use  . Smoking status: Never Smoker  . Smokeless tobacco: Never Used  Substance Use Topics  . Alcohol use: No      Objective:   There were no vitals taken for this visit. There were no vitals filed for this visit.There is no height or weight on file to  calculate BMI.   Physical Exam Vitals reviewed.  Constitutional:      General: She is not in acute distress. Pulmonary:     Effort: No respiratory distress.  Neurological:     Mental Status: She is alert.      No results found for any visits on 06/06/19.     Assessment & Plan    1. Acute cystitis without hematuria Patient has developed intolerances with muscle cramps to multiple antibiotics including cipro, keflex and macrobid. She has also had a rash with Pen G and gets nausea with bactrim. Symptoms have improved some with AZO tabs and pushing fluids. However, at home test still shows leukocytes present. Will send in Augmentin as below. Advised if she develops rash or muscle cramps to call the office and stop medication. If UTI persists or when she has another, may consider doxycycline next.  - amoxicillin-clavulanate (AUGMENTIN) 875-125 MG tablet; Take 1 tablet by mouth 2 (two) times daily.  Dispense: 14 tablet; Refill: 0    I discussed the assessment and treatment plan with the patient. The patient was provided an opportunity to ask questions and all were answered. The patient agreed with the plan and demonstrated an understanding of the instructions.   The patient was advised to call back or seek an in-person evaluation if the symptoms worsen or if the condition fails to improve as anticipated.  I provided 11 minutes of non-face-to-face time during this encounter.  Mar Daring, PA-C  [facility]

## 2019-06-10 ENCOUNTER — Telehealth: Payer: Self-pay | Admitting: Physician Assistant

## 2019-06-10 DIAGNOSIS — R928 Other abnormal and inconclusive findings on diagnostic imaging of breast: Secondary | ICD-10-CM

## 2019-06-10 NOTE — Telephone Encounter (Signed)
Order placed for bilateral diagnostic mammogram to f/u left breast calcifications

## 2019-06-10 NOTE — Telephone Encounter (Signed)
Patient advised.

## 2019-06-10 NOTE — Telephone Encounter (Signed)
Copied from [place] [id number]. Topic: General - Other >> Jun 10, 2019  1:50 PM Keene Breath wrote: Reason for CRM: Patient is calling to request a referral from [facility].  Please call patient to confirm at [phone]

## 2019-07-30 ENCOUNTER — Ambulatory Visit
Admission: RE | Admit: 2019-07-30 | Discharge: 2019-07-30 | Disposition: A | Discharge status: Home / Self Care | Payer: Medicare Other | Source: Ambulatory Visit | Attending: Physician Assistant | Admitting: Physician Assistant

## 2019-07-30 DIAGNOSIS — R921 Mammographic calcification found on diagnostic imaging of breast: Secondary | ICD-10-CM | POA: Diagnosis not present

## 2019-07-30 DIAGNOSIS — R928 Other abnormal and inconclusive findings on diagnostic imaging of breast: Secondary | ICD-10-CM | POA: Insufficient documentation

## 2019-07-31 ENCOUNTER — Telehealth: Payer: Self-pay

## 2019-07-31 NOTE — Telephone Encounter (Signed)
-----   Message from Mar Daring, [place] sent at 07/31/2019 10:19 AM EDT ----- Appear to have bilateral calcifications. Repeat bilateral diagnostic mammogram in 6 months.

## 2019-07-31 NOTE — Telephone Encounter (Signed)
Pt advised.   Thanks,   -Holiday Mcmenamin  

## 2019-10-01 DIAGNOSIS — M25561 Pain in right knee: Secondary | ICD-10-CM | POA: Diagnosis not present

## 2019-10-01 DIAGNOSIS — M25562 Pain in left knee: Secondary | ICD-10-CM | POA: Diagnosis not present

## 2019-10-01 DIAGNOSIS — M170 Bilateral primary osteoarthritis of knee: Secondary | ICD-10-CM | POA: Diagnosis not present

## 2019-10-01 DIAGNOSIS — G8929 Other chronic pain: Secondary | ICD-10-CM | POA: Diagnosis not present

## 2019-10-09 ENCOUNTER — Telehealth: Payer: Self-pay

## 2019-10-09 DIAGNOSIS — N3000 Acute cystitis without hematuria: Secondary | ICD-10-CM

## 2019-10-09 MED ORDER — AMOXICILLIN-POT CLAVULANATE 875-125 MG PO TABS: 1.0000 | ORAL_TABLET | Freq: Two times a day (BID) | ORAL | 0 refills | Status: DC

## 2019-10-09 NOTE — Telephone Encounter (Signed)
Patient advised.

## 2019-10-09 NOTE — Telephone Encounter (Signed)
Copied from Megan Cervantes [id number]. Topic: General - Other >> Oct 09, 2019 12:40 PM Mcneil, Ja-Kwan wrote: Reason for CRM: Pt stated she needs to speak with Claybon Jabs nurse. Pt stated she normally just gets a Rx called in and does not have to schedule an appt. Pt requests call back. Cb# [phone]

## 2019-10-09 NOTE — Telephone Encounter (Signed)
Reports that she is having some pain with urination. She was treated last time with Augmentin and was wondering if she really needs the appointment on Friday or if she can just come in give a sample and if you can send the antibiotic in. She has started to take AZO and she checked her urine and it was positive for nitrate. Please advise.

## 2019-10-09 NOTE — Telephone Encounter (Signed)
Augmentin sent in.  

## 2019-10-11 ENCOUNTER — Ambulatory Visit: Payer: Medicare Other | Admitting: Physician Assistant

## 2019-10-24 ENCOUNTER — Other Ambulatory Visit: Payer: Self-pay | Admitting: Physician Assistant

## 2019-10-24 DIAGNOSIS — K219 Gastro-esophageal reflux disease without esophagitis: Secondary | ICD-10-CM

## 2019-11-07 NOTE — Progress Notes (Signed)
Subjective:   Megan Cervantes is a 74 y.o. female who presents for an Initial Medicare Annual Wellness Visit.  I connected with Clista Rainford today by telephone and verified that I am speaking with the correct person using two identifiers. Location patient: home Location provider: work Persons participating in the virtual visit: patient, provider.   I discussed the limitations, risks, security and privacy concerns of performing an evaluation and management service by telephone and the availability of in person appointments. I also discussed with the patient that there may be a patient responsible charge related to this service. The patient expressed understanding and verbally consented to this telephonic visit.    Interactive audio and video telecommunications were attempted between this provider and patient, however failed, due to patient having technical difficulties OR patient did not have access to video capability.  We continued and completed visit with audio only.   Review of Systems    N/A  Cardiac Risk Factors include: advanced age (>58men, >55 women)     Objective:    There were no vitals filed for this visit. There is no height or weight on file to calculate BMI.  Advanced Directives 11/12/2019 10/31/2018 08/15/2017 11/04/2016  Does Patient Have a Medical Advance Directive? Yes Yes Yes Yes  Type of Paramedic of [place];Living will [place];Living will Living will Living will  Copy of [place] in Chart? No - copy requested No - copy requested - -    Current Medications (verified) Outpatient Encounter Medications as of 11/12/2019  Medication Sig  . BIOTIN PO Take by mouth daily.   . Cholecalciferol (VITAMIN D) 2000 units CAPS Take 1 capsule by mouth daily.  Marland Kitchen CRANBERRY CONCENTRATE PO Take by mouth daily.   Marland Kitchen ibuprofen (ADVIL) 800 MG tablet Take 1 tablet (800 mg total) by mouth every 8 (eight) hours as needed.    . magnesium oxide (MAG-OX) 400 MG tablet Take 1 tablet (400 mg total) by mouth daily.  . meloxicam (MOBIC) 7.5 MG tablet Take 7.5 mg by mouth daily.   . Misc Natural Products (GLUCOSAMINE CHOND COMPLEX/MSM PO) Take 1 tablet by mouth daily.  . Multiple Vitamins-Minerals (CENTRUM SILVER PO) Take 1 tablet by mouth daily.  Marland Kitchen omeprazole (PRILOSEC) 40 MG capsule TAKE 1 CAPSULE(40 MG) BY MOUTH DAILY  . potassium chloride (K-DUR) 10 MEQ tablet Take 1 tablet (10 mEq total) by mouth at bedtime.  . pseudoephedrine-acetaminophen (TYLENOL SINUS) 30-500 MG TABS tablet Take 1 tablet by mouth every 4 (four) hours as needed.  . TURMERIC PO Take by mouth daily.   . vitamin B-12 (CYANOCOBALAMIN) 1000 MCG tablet Take 1,000 mcg by mouth daily.  . vitamin C (ASCORBIC ACID) 250 MG tablet Take 250 mg by mouth daily.  Marland Kitchen amoxicillin-clavulanate (AUGMENTIN) 875-125 MG tablet Take 1 tablet by mouth 2 (two) times daily. (Patient not taking: Reported on 11/12/2019)  . nitrofurantoin, macrocrystal-monohydrate, (MACROBID) 100 MG capsule Take 1 capsule (100 mg total) by mouth 2 (two) times daily. (Patient not taking: Reported on 06/06/2019)  . polyethylene glycol powder (GLYCOLAX/MIRALAX) powder Take 17 g by mouth 2 (two) times daily as needed. (Patient not taking: Reported on 11/12/2019)   No facility-administered encounter medications on file as of 11/12/2019.    Allergies (verified) Azithromycin, Bactrim [sulfamethoxazole-trimethoprim], Cephalexin, Ciprofloxacin, Macrobid [nitrofurantoin], and Penicillin g   History: Past Medical History:  Diagnosis Date  . Hyperlipidemia    Past Surgical History:  Procedure Laterality Date  . ABDOMINAL HYSTERECTOMY  1986   total  . APPENDECTOMY    . BIOPSY THYROID Bilateral   . BREAST CYST ASPIRATION Left 1990's  . CHOLECYSTECTOMY  90's  . diverticulisitis  2016   removed about a foot of colon  . EYE SURGERY Bilateral 2010   cataract  . HERNIA REPAIR  11/2015   mesh covers large  portion of stomach   Family History  Problem Relation Age of Onset  . Breast cancer Neg Hx    Social History   Socioeconomic History  . Marital status: Married    Spouse name: Not on file  . Number of children: 0  . Years of education: Not on file  . Highest education level: Some college, no degree  Occupational History  . 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 Smoking status: Never Smoker  . Smokeless tobacco: Never Used  Vaping Use  . Vaping Use: Never used  Substance and Sexual Activity  . Alcohol use: No  . Drug use: No  . Sexual activity: Not on file  Other Topics Concern  . Not on file  Social History Narrative  . Not on file   Social Determinants of Health   Financial Resource Strain: Low Risk   . Difficulty of Paying Living Expenses: Not hard at all  Food Insecurity: No Food Insecurity  . Worried About Charity fundraiser in the Last Year: Never true  . Ran Out of Food in the Last Year: Never true  Transportation Needs: No Transportation Needs  . Lack of Transportation (Medical): No  . Lack of Transportation (Non-Medical): No  Physical Activity: Inactive  . Days of Exercise per Week: 0 days  . Minutes of Exercise per Session: 0 min  Stress: No Stress Concern Present  . Feeling of Stress : Not at all  Social Connections: Socially Integrated  . Frequency of Communication with Friends and Family: More than three times a week  . Frequency of Social Gatherings with Friends and Family: More than three times a week  . Attends Religious Services: More than 4 times per year  . Active Member of Clubs or Organizations: Yes  . Attends Archivist Meetings: More than 4 times per year  . Marital Status: Married    Tobacco Counseling Counseling given: Not Answered   Clinical Intake:  Pre-visit preparation completed: Yes  Pain : No/denies pain     Nutritional Risks: None Diabetes: No  How often do you need  to have someone help you when you read instructions, pamphlets, or other written materials from your doctor or pharmacy?: 1 - Never  Diabetic? No  Interpreter Needed?: No  Information entered by :: [facility], LPN   Activities of Daily Living In your present state of health, do you have any difficulty performing the following activities: 11/12/2019  Hearing? N  Vision? N  Difficulty concentrating or making decisions? N  Walking or climbing stairs? N  Dressing or bathing? N  Doing errands, shopping? N  Preparing Food and eating ? N  Using the Toilet? N  In the past six months, have you accidently leaked urine? N  Do you have problems with loss of bowel control? N  Managing your Medications? N  Managing your Finances? N  Housekeeping or managing your Housekeeping? N  Some recent data might be hidden    Patient Care Team: Mar Daring, PA-C as PCP - General (Family Medicine) Estill Cotta, MD as  Consulting Physician (Ophthalmology) Marry Guan, Laurice Record, MD (Orthopedic Surgery)  Indicate any recent Medical Services you may have received from other than [facility] providers in the past year (date may be approximate).     Assessment:   This is a routine wellness examination for Megan Cervantes.  Hearing/Vision screen No exam data present  Dietary issues and exercise activities discussed: Current Exercise Habits: The patient does not participate in regular exercise at present, Exercise limited by: None identified  Goals    . DIET - INCREASE WATER INTAKE     Recommend increasing water intake to 3-4 glasses a day.     . Exercise 3x per week (30 min per time)     Recommend to exercise for 3 days a week for at least 30 minutes at a time.       Depression Screen PHQ 2/9 Scores 11/12/2019 10/31/2018 08/15/2017 08/15/2017 07/19/2016  PHQ - 2 Score 0 0 0 0 0  PHQ- 9 Score - - 0 - 0    Fall Risk Fall Risk  11/12/2019 12/06/2018 10/31/2018 08/15/2017 07/19/2016  Falls in the past year? 0 0 0 No No    Comment - Emmi Telephone Survey: data to providers prior to load - - -  Number falls in past yr: 0 - - - -  Injury with Fall? 0 - - - -    Any stairs in or around the home? Yes  If so, are there any without handrails? No  Home free of loose throw rugs in walkways, pet beds, electrical cords, etc? Yes  Adequate lighting in your home to reduce risk of falls? Yes   ASSISTIVE DEVICES UTILIZED TO PREVENT FALLS:  Life alert? No  Use of a cane, walker or w/c? No  Grab bars in the bathroom? No  Shower chair or bench in shower? Yes  Elevated toilet seat or a handicapped toilet? Yes    Cognitive Function:     6CIT Screen 11/12/2019 10/31/2018 08/15/2017  What Year? 0 points 0 points 0 points  What month? 0 points 0 points 0 points  What time? 0 points 0 points 0 points  Count back from 20 0 points 0 points 0 points  Months in reverse 0 points 0 points 0 points  Repeat phrase 0 points 0 points 4 points  Total Score 0 0 4    Immunizations Immunization History  Administered Date(s) Administered  . Hepatitis A 02/14/2013  . Influenza, High Dose Seasonal PF 03/19/2018  . Influenza-Unspecified 02/15/2017  . Pneumococcal Conjugate-13 11/05/2013  . Pneumococcal Polysaccharide-23 05/07/2012  . Td 05/07/2012  . Tdap 05/22/2015  . Zoster 05/22/2015    TDAP status: Up to date Flu Vaccine status: Up to date Pneumococcal vaccine status: Up to date Covid-19 vaccine status: Declined, Education has been provided regarding the importance of this vaccine but patient still declined. Advised may receive this vaccine at local pharmacy or Health Dept.or vaccine clinic. Aware to provide a copy of the vaccination record if obtained from local pharmacy or Health Dept. Verbalized acceptance and understanding.  Qualifies for Shingles Vaccine? Yes   Zostavax completed Yes   Shingrix Completed?: No.    Education has been provided regarding the importance of this vaccine. Patient has been advised to call  insurance company to determine out of pocket expense if they have not yet received this vaccine. Advised may also receive vaccine at local pharmacy or Health Dept. Verbalized acceptance and understanding.  Screening Tests Health Maintenance  Topic Date Due  .  COVID-19 Vaccine (1) Never done  . DEXA SCAN  Never done  . INFLUENZA VACCINE  12/08/2019  . MAMMOGRAM  07/29/2021  . COLONOSCOPY  07/23/2024  . TETANUS/TDAP  05/21/2025  . Hepatitis C Screening  Completed  . PNA vac Low Risk Adult  Completed    Health Maintenance  Health Maintenance Due  Topic Date Due  . COVID-19 Vaccine (1) Never done  . DEXA SCAN  Never done    Colorectal cancer screening: Completed 07/24/14. Repeat every 10 years Mammogram status: Completed 07/30/19. Repeat every year Bone Density status: Currently due. Pt declined order at this time.  Lung Cancer Screening: (Low Dose CT Chest recommended if Age 60-80 years, 30 pack-year currently smoking OR have quit w/in 15years.) does not qualify.   Additional Screening:  Hepatitis C Screening: Up to date  Vision Screening: Recommended annual ophthalmology exams for early detection of glaucoma and other disorders of the eye. Is the patient up to date with their annual eye exam?  Yes  Who is the provider or what is the name of the office in which the patient attends annual eye exams? Dr Dingeldein @ [facility] If pt is not established with a provider, would they like to be referred to a provider to establish care? No .   Dental Screening: Recommended annual dental exams for proper oral hygiene  Community Resource Referral / Chronic Care Management: CRR required this visit?  No   CCM required this visit?  No      Plan:     I have personally reviewed and noted the following in the patient's chart:   . Medical and social history . Use of alcohol, tobacco or illicit drugs  . Current medications and supplements . Functional ability and status . Nutritional  status . Physical activity . Advanced directives . List of other physicians . Hospitalizations, surgeries, and ER visits in previous 12 months . Vitals . Screenings to include cognitive, depression, and falls . Referrals and appointments  In addition, I have reviewed and discussed with patient certain preventive protocols, quality metrics, and best practice recommendations. A written personalized care plan for preventive services as well as general preventive health recommendations were provided to patient.     Megan Cervantes, [place]   09/13/2618   Nurse Notes: Declined a DEXA order at this time.

## 2019-11-12 ENCOUNTER — Other Ambulatory Visit: Payer: Self-pay

## 2019-11-12 ENCOUNTER — Ambulatory Visit (INDEPENDENT_AMBULATORY_CARE_PROVIDER_SITE_OTHER): Payer: Medicare Other

## 2019-11-12 DIAGNOSIS — Z0000 Encounter for general adult medical examination without abnormal findings: Secondary | ICD-10-CM

## 2019-11-12 NOTE — Patient Instructions (Addendum)
Megan Cervantes , Thank you for taking time to come for your Medicare Wellness Visit. I appreciate your ongoing commitment to your health goals. Please review the following plan we discussed and let me know if I can assist you in the future.   Screening recommendations/referrals: Colonoscopy: Up to date, due 07/2024 Mammogram: Up to date, due 07/2020 Bone Density: Currently due, declined order at this time. Recommended yearly ophthalmology/optometry visit for glaucoma screening and checkup Recommended yearly dental visit for hygiene and checkup  Vaccinations: Influenza vaccine: Done 02/25/19 Pneumococcal vaccine: Completed series Tdap vaccine: Up to date, due 05/2025 Shingles vaccine: Currently due, declined today.    Advanced directives: Please bring a copy of your POA (Power of Attorney) and/or Living Will to your next appointment.   Conditions/risks identified: Recommend to exercise for 3 days a week for at least 30 minutes at a time. Continue to increase water intake to 6-8 8 oz glasses a day.  Next appointment: None, declined scheduling a follow up with PCP or an AWV for 2022 at this time.   Preventive Care 14 Years and Older, Female Preventive care refers to lifestyle choices and visits with your health care provider that can promote health and wellness. What does preventive care include?  A yearly physical exam. This is also called an annual well check.  Dental exams once or twice a year.  Routine eye exams. Ask your health care provider how often you should have your eyes checked.  Personal lifestyle choices, including:  Daily care of your teeth and gums.  Regular physical activity.  Eating a healthy diet.  Avoiding tobacco and drug use.  Limiting alcohol use.  Practicing safe sex.  Taking low-dose aspirin every day.  Taking vitamin and mineral supplements as recommended by your health care provider. What happens during an annual well check? The services and  screenings done by your health care provider during your annual well check will depend on your age, overall health, lifestyle risk factors, and family history of disease. Counseling  Your health care provider may ask you questions about your:  Alcohol use.  Tobacco use.  Drug use.  Emotional well-being.  Home and relationship well-being.  Sexual activity.  Eating habits.  History of falls.  Memory and ability to understand (cognition).  Work and work Statistician.  Reproductive health. Screening  You may have the following tests or measurements:  Height, weight, and BMI.  Blood pressure.  Lipid and cholesterol levels. These may be checked every 5 years, or more frequently if you are over 22 years old.  Skin check.  Lung cancer screening. You may have this screening every year starting at age 48 if you have a 30-pack-year history of smoking and currently smoke or have quit within the past 15 years.  Fecal occult blood test (FOBT) of the stool. You may have this test every year starting at age 48.  Flexible sigmoidoscopy or colonoscopy. You may have a sigmoidoscopy every 5 years or a colonoscopy every 10 years starting at age 71.  Hepatitis C blood test.  Hepatitis B blood test.  Sexually transmitted disease (STD) testing.  Diabetes screening. This is done by checking your blood sugar (glucose) after you have not eaten for a while (fasting). You may have this done every 1-3 years.  Bone density scan. This is done to screen for osteoporosis. You may have this done starting at age 8.  Mammogram. This may be done every 1-2 years. Talk to your health care provider about  how often you should have regular mammograms. Talk with your health care provider about your test results, treatment options, and if necessary, the need for more tests. Vaccines  Your health care provider may recommend certain vaccines, such as:  Influenza vaccine. This is recommended every  year.  Tetanus, diphtheria, and acellular pertussis (Tdap, Td) vaccine. You may need a Td booster every 10 years.  Zoster vaccine. You may need this after age 34.  Pneumococcal 13-valent conjugate (PCV13) vaccine. One dose is recommended after age 106.  Pneumococcal polysaccharide (PPSV23) vaccine. One dose is recommended after age 33. Talk to your health care provider about which screenings and vaccines you need and how often you need them. This information is not intended to replace advice given to you by your health care provider. Make sure you discuss any questions you have with your health care provider. Document Released: 05/22/2015 Document Revised: 01/13/2016 Document Reviewed: 02/24/2015 Elsevier Interactive Patient Education  2017 [place] Prevention in the Home Falls can cause injuries. They can happen to people of all ages. There are many things you can do to make your home safe and to help prevent falls. What can I do on the outside of my home?  Regularly fix the edges of walkways and driveways and fix any cracks.  Remove anything that might make you trip as you walk through a door, such as a raised step or threshold.  Trim any bushes or trees on the path to your home.  Use bright outdoor lighting.  Clear any walking paths of anything that might make someone trip, such as rocks or tools.  Regularly check to see if handrails are loose or broken. Make sure that both sides of any steps have handrails.  Any raised decks and porches should have guardrails on the edges.  Have any leaves, snow, or ice cleared regularly.  Use sand or salt on walking paths during winter.  Clean up any spills in your garage right away. This includes oil or grease spills. What can I do in the bathroom?  Use night lights.  Install grab bars by the toilet and in the tub and shower. Do not use towel bars as grab bars.  Use non-skid mats or decals in the tub or shower.  If you  need to sit down in the shower, use a plastic, non-slip stool.  Keep the floor dry. Clean up any water that spills on the floor as soon as it happens.  Remove soap buildup in the tub or shower regularly.  Attach bath mats securely with double-sided non-slip rug tape.  Do not have throw rugs and other things on the floor that can make you trip. What can I do in the bedroom?  Use night lights.  Make sure that you have a light by your bed that is easy to reach.  Do not use any sheets or blankets that are too big for your bed. They should not hang down onto the floor.  Have a firm chair that has side arms. You can use this for support while you get dressed.  Do not have throw rugs and other things on the floor that can make you trip. What can I do in the kitchen?  Clean up any spills right away.  Avoid walking on wet floors.  Keep items that you use a lot in easy-to-reach places.  If you need to reach something above you, use a strong step stool that has a grab bar.  Keep  electrical cords out of the way.  Do not use floor polish or wax that makes floors slippery. If you must use wax, use non-skid floor wax.  Do not have throw rugs and other things on the floor that can make you trip. What can I do with my stairs?  Do not leave any items on the stairs.  Make sure that there are handrails on both sides of the stairs and use them. Fix handrails that are broken or loose. Make sure that handrails are as long as the stairways.  Check any carpeting to make sure that it is firmly attached to the stairs. Fix any carpet that is loose or worn.  Avoid having throw rugs at the top or bottom of the stairs. If you do have throw rugs, attach them to the floor with carpet tape.  Make sure that you have a light switch at the top of the stairs and the bottom of the stairs. If you do not have them, ask someone to add them for you. What else can I do to help prevent falls?  Wear shoes  that:  Do not have high heels.  Have rubber bottoms.  Are comfortable and fit you well.  Are closed at the toe. Do not wear sandals.  If you use a stepladder:  Make sure that it is fully opened. Do not climb a closed stepladder.  Make sure that both sides of the stepladder are locked into place.  Ask someone to hold it for you, if possible.  Clearly mark and make sure that you can see:  Any grab bars or handrails.  First and last steps.  Where the edge of each step is.  Use tools that help you move around (mobility aids) if they are needed. These include:  Canes.  Walkers.  Scooters.  Crutches.  Turn on the lights when you go into a dark area. Replace any light bulbs as soon as they burn out.  Set up your furniture so you have a clear path. Avoid moving your furniture around.  If any of your floors are uneven, fix them.  If there are any pets around you, be aware of where they are.  Review your medicines with your doctor. Some medicines can make you feel dizzy. This can increase your chance of falling. Ask your doctor what other things that you can do to help prevent falls. This information is not intended to replace advice given to you by your health care provider. Make sure you discuss any questions you have with your health care provider. Document Released: 02/19/2009 Document Revised: 10/01/2015 Document Reviewed: 05/30/2014 Elsevier Interactive Patient Education  2017 [employer].

## 2019-11-22 ENCOUNTER — Other Ambulatory Visit: Payer: Self-pay | Admitting: Physician Assistant

## 2019-11-22 DIAGNOSIS — K219 Gastro-esophageal reflux disease without esophagitis: Secondary | ICD-10-CM

## 2019-11-22 NOTE — Telephone Encounter (Signed)
Requested  medications are  due for refill today yes  Requested medications are on the active medication list yes  Last refill 10/24/19  Last visit for GERD 10/2018  Future visit scheduled no  Notes to clinic Looks like had curtesy refill 6/17 with no appt scheduled. Last appt addressing GERD 10/2018

## 2019-11-28 DIAGNOSIS — Z20822 Contact with and (suspected) exposure to covid-19: Secondary | ICD-10-CM | POA: Diagnosis not present

## 2019-11-28 DIAGNOSIS — Z03818 Encounter for observation for suspected exposure to other biological agents ruled out: Secondary | ICD-10-CM | POA: Diagnosis not present

## 2019-11-28 DIAGNOSIS — R030 Elevated blood-pressure reading, without diagnosis of hypertension: Secondary | ICD-10-CM | POA: Diagnosis not present

## 2019-11-28 DIAGNOSIS — J069 Acute upper respiratory infection, unspecified: Secondary | ICD-10-CM | POA: Diagnosis not present

## 2019-11-28 DIAGNOSIS — R05 Cough: Secondary | ICD-10-CM | POA: Diagnosis not present

## 2019-12-20 ENCOUNTER — Other Ambulatory Visit: Payer: Self-pay | Admitting: Physician Assistant

## 2019-12-20 DIAGNOSIS — K219 Gastro-esophageal reflux disease without esophagitis: Secondary | ICD-10-CM

## 2019-12-20 DIAGNOSIS — R928 Other abnormal and inconclusive findings on diagnostic imaging of breast: Secondary | ICD-10-CM

## 2019-12-20 NOTE — Telephone Encounter (Signed)
Requested  medications are  due for refill today yes  Requested medications are on the active medication list yes  Last refill 7/16  Last visit June 2020  Future visit scheduled No  Notes to clinic Failed protocol of visit within 12 months, no upcoming visit scheduled

## 2020-01-22 ENCOUNTER — Other Ambulatory Visit: Payer: Self-pay | Admitting: Physician Assistant

## 2020-01-22 DIAGNOSIS — K219 Gastro-esophageal reflux disease without esophagitis: Secondary | ICD-10-CM

## 2020-01-22 NOTE — Telephone Encounter (Signed)
Requested medications are due for refill today?  Yes  Requested medications are on active medication list?  Yes  Last Refill:  12/20/2019  # 30 no refills with reminder for patient to schedule an appointment for any further refills.    Future visit scheduled?   No   Notes to Clinic:  See above.

## 2020-01-27 ENCOUNTER — Other Ambulatory Visit: Payer: Self-pay | Admitting: Physician Assistant

## 2020-01-27 DIAGNOSIS — K219 Gastro-esophageal reflux disease without esophagitis: Secondary | ICD-10-CM

## 2020-01-31 ENCOUNTER — Ambulatory Visit
Admission: RE | Admit: 2020-01-31 | Discharge: 2020-01-31 | Disposition: A | Discharge status: Home / Self Care | Payer: Medicare Other | Source: Ambulatory Visit | Attending: Physician Assistant | Admitting: Physician Assistant

## 2020-01-31 ENCOUNTER — Other Ambulatory Visit: Payer: Self-pay

## 2020-01-31 DIAGNOSIS — R928 Other abnormal and inconclusive findings on diagnostic imaging of breast: Secondary | ICD-10-CM | POA: Diagnosis not present

## 2020-01-31 DIAGNOSIS — R921 Mammographic calcification found on diagnostic imaging of breast: Secondary | ICD-10-CM | POA: Diagnosis not present

## 2020-02-12 DIAGNOSIS — L821 Other seborrheic keratosis: Secondary | ICD-10-CM | POA: Diagnosis not present

## 2020-02-12 DIAGNOSIS — D2262 Melanocytic nevi of left upper limb, including shoulder: Secondary | ICD-10-CM | POA: Diagnosis not present

## 2020-02-12 DIAGNOSIS — Z85828 Personal history of other malignant neoplasm of skin: Secondary | ICD-10-CM | POA: Diagnosis not present

## 2020-02-12 DIAGNOSIS — D2261 Melanocytic nevi of right upper limb, including shoulder: Secondary | ICD-10-CM | POA: Diagnosis not present

## 2020-02-12 DIAGNOSIS — D2272 Melanocytic nevi of left lower limb, including hip: Secondary | ICD-10-CM | POA: Diagnosis not present

## 2020-02-12 DIAGNOSIS — D225 Melanocytic nevi of trunk: Secondary | ICD-10-CM | POA: Diagnosis not present

## 2020-03-08 ENCOUNTER — Other Ambulatory Visit: Payer: Self-pay | Admitting: Physician Assistant

## 2020-03-08 DIAGNOSIS — K219 Gastro-esophageal reflux disease without esophagitis: Secondary | ICD-10-CM

## 2020-03-08 NOTE — Telephone Encounter (Signed)
Requested Prescriptions  Pending Prescriptions Disp Refills  . omeprazole (PRILOSEC) 40 MG capsule [Pharmacy Med Name: OMEPRAZOLE 40MG  CAPSULES] 30 capsule 0    Sig: TAKE 1 CAPSULE(40 MG) BY MOUTH DAILY     Gastroenterology: Proton Pump Inhibitors Failed - 03/08/2020 12:50 PM      Failed - Valid encounter within last 12 months    Recent Outpatient Visits          9 months ago Acute cystitis without hematuria   [place], [place]   1 year ago Nocturnal leg cramps   [place], [place]   1 year ago Gastroesophageal reflux disease without esophagitis   [facility], [place], [place]   1 year ago [place], [place], [place]   2 years ago Primary osteoarthritis of both [place], [place], [place]

## 2020-04-04 ENCOUNTER — Other Ambulatory Visit: Payer: Self-pay | Admitting: Physician Assistant

## 2020-04-04 DIAGNOSIS — K219 Gastro-esophageal reflux disease without esophagitis: Secondary | ICD-10-CM

## 2020-04-04 NOTE — Telephone Encounter (Signed)
Requested medications are due for refill today yes  Requested medications are on the active medication list yes  Last refill 10/31  Last visit 05/2019  Future visit scheduled No  Notes to clinic Already given curtesy refill with instructions to call for appt. No appt scheduled.

## 2020-07-06 ENCOUNTER — Telehealth: Payer: Self-pay

## 2020-07-06 DIAGNOSIS — K219 Gastro-esophageal reflux disease without esophagitis: Secondary | ICD-10-CM

## 2020-07-06 NOTE — Telephone Encounter (Signed)
[facility] faxed refill request for the following medications:  omeprazole (PRILOSEC) 40 MG capsule  Please advise.

## 2020-07-07 ENCOUNTER — Encounter: Payer: Self-pay | Admitting: Adult Health

## 2020-07-07 ENCOUNTER — Ambulatory Visit (INDEPENDENT_AMBULATORY_CARE_PROVIDER_SITE_OTHER): Payer: Medicare Other | Admitting: Adult Health

## 2020-07-07 ENCOUNTER — Other Ambulatory Visit: Payer: Self-pay

## 2020-07-07 VITALS — BP 132/80 | HR 79 | Temp 97.7°F | Resp 16 | Wt 151.6 lb

## 2020-07-07 DIAGNOSIS — E7800 Pure hypercholesterolemia, unspecified: Secondary | ICD-10-CM | POA: Diagnosis not present

## 2020-07-07 DIAGNOSIS — M1990 Unspecified osteoarthritis, unspecified site: Secondary | ICD-10-CM

## 2020-07-07 DIAGNOSIS — K219 Gastro-esophageal reflux disease without esophagitis: Secondary | ICD-10-CM | POA: Diagnosis not present

## 2020-07-07 DIAGNOSIS — R7309 Other abnormal glucose: Secondary | ICD-10-CM

## 2020-07-07 MED ORDER — OMEPRAZOLE 40 MG PO CPDR: DELAYED_RELEASE_CAPSULE | ORAL | 0 refills | Status: DC

## 2020-07-07 MED ORDER — OMEPRAZOLE 40 MG PO CPDR: DELAYED_RELEASE_CAPSULE | ORAL | 1 refills | Status: DC

## 2020-07-07 NOTE — Patient Instructions (Signed)
Gastroesophageal Reflux Disease, Adult  Gastroesophageal reflux (GER) happens when acid from the stomach flows up into the tube that connects the mouth and the stomach (esophagus). Normally, food travels down the esophagus and stays in the stomach to be digested. With GER, food and stomach acid sometimes move back up into the esophagus. You may have a disease called gastroesophageal reflux disease (GERD) if the reflux:  Happens often.  Causes frequent or very bad symptoms.  Causes problems such as damage to the esophagus. When this happens, the esophagus becomes sore and swollen. Over time, GERD can make small holes (ulcers) in the lining of the esophagus. What are the causes? This condition is caused by a problem with the muscle between the esophagus and the stomach. When this muscle is weak or not normal, it does not close properly to keep food and acid from coming back up from the stomach. The muscle can be weak because of:  Tobacco use.  Pregnancy.  Having a certain type of hernia (hiatal hernia).  Alcohol use.  Certain foods and drinks, such as coffee, chocolate, onions, and peppermint. What increases the risk?  Being overweight.  Having a disease that affects your connective tissue.  Taking NSAIDs, such a ibuprofen. What are the signs or symptoms?  Heartburn.  Difficult or painful swallowing.  The feeling of having a lump in the throat.  A bitter taste in the mouth.  Bad breath.  Having a lot of saliva.  Having an upset or bloated stomach.  Burping.  Chest pain. Different conditions can cause chest pain. Make sure you see your doctor if you have chest pain.  Shortness of breath or wheezing.  A long-term cough or a cough at night.  Wearing away of the surface of teeth (tooth enamel).  Weight loss. How is this treated?  Making changes to your diet.  Taking medicine.  Having surgery. Treatment will depend on how bad your symptoms are. Follow these  instructions at home: Eating and drinking  Follow a diet as told by your doctor. You may need to avoid foods and drinks such as: ? Coffee and tea, with or without caffeine. ? Drinks that contain alcohol. ? Energy drinks and sports drinks. ? Bubbly (carbonated) drinks or sodas. ? Chocolate and cocoa. ? Peppermint and mint flavorings. ? Garlic and onions. ? Horseradish. ? Spicy and acidic foods. These include peppers, chili powder, curry powder, vinegar, hot sauces, and BBQ sauce. ? Citrus fruit juices and citrus fruits, such as oranges, lemons, and limes. ? Tomato-based foods. These include red sauce, chili, salsa, and pizza with red sauce. ? Fried and fatty foods. These include donuts, french fries, potato chips, and high-fat dressings. ? High-fat meats. These include hot dogs, rib eye steak, sausage, ham, and bacon. ? High-fat dairy items, such as whole milk, butter, and cream cheese.  Eat small meals often. Avoid eating large meals.  Avoid drinking large amounts of liquid with your meals.  Avoid eating meals during the 2-3 hours before bedtime.  Avoid lying down right after you eat.  Do not exercise right after you eat.   Lifestyle  Do not smoke or use any products that contain nicotine or tobacco. If you need help quitting, ask your doctor.  Try to lower your stress. If you need help doing this, ask your doctor.  If you are overweight, lose an amount of weight that is healthy for you. Ask your doctor about a safe weight loss goal.   General instructions  Pay attention to any changes in your symptoms.  Take over-the-counter and prescription medicines only as told by your doctor.  Do not take aspirin, ibuprofen, or other NSAIDs unless your doctor says it is okay.  Wear loose clothes. Do not wear anything tight around your waist.  Raise (elevate) the head of your bed about 6 inches (15 cm). You may need to use a wedge to do this.  Avoid bending over if this makes your  symptoms worse.  Keep all follow-up visits. Contact a doctor if:  You have new symptoms.  You lose weight and you do not know why.  You have trouble swallowing or it hurts to swallow.  You have wheezing or a cough that keeps happening.  You have a hoarse voice.  Your symptoms do not get better with treatment. Get help right away if:  You have sudden pain in your arms, neck, jaw, teeth, or back.  You suddenly feel sweaty, dizzy, or light-headed.  You have chest pain or shortness of breath.  You vomit and the vomit is green, yellow, or black, or it looks like blood or coffee grounds.  You faint.  Your poop (stool) is red, bloody, or black.  You cannot swallow, drink, or eat. These symptoms may represent a serious problem that is an emergency. Do not wait to see if the symptoms will go away. Get medical help right away. Call your local emergency services (911 in the [place] Do not drive yourself to the hospital. Summary  If a person has gastroesophageal reflux disease (GERD), food and stomach acid move back up into the esophagus and cause symptoms or problems such as damage to the esophagus.  Treatment will depend on how bad your symptoms are.  Follow a diet as told by your doctor.  Take all medicines only as told by your doctor. This information is not intended to replace advice given to you by your health care provider. Make sure you discuss any questions you have with your health care provider. Document Revised: 11/04/2019 Document Reviewed: 11/04/2019 Elsevier Patient Education  [place] Maintenance, Female Adopting a healthy lifestyle and getting preventive care are important in promoting health and wellness. Ask your health care provider about:  The right schedule for you to have regular tests and exams.  Things you can do on your own to prevent diseases and keep yourself healthy. What should I know about diet, weight, and exercise? Eat a healthy  diet  Eat a diet that includes plenty of vegetables, fruits, low-fat dairy products, and lean protein.  Do not eat a lot of foods that are high in solid fats, added sugars, or sodium.   Maintain a healthy weight Body mass index (BMI) is used to identify weight problems. It estimates body fat based on height and weight. Your health care provider can help determine your BMI and help you achieve or maintain a healthy weight. Get regular exercise Get regular exercise. This is one of the most important things you can do for your health. Most adults should:  Exercise for at least 150 minutes each week. The exercise should increase your heart rate and make you sweat (moderate-intensity exercise).  Do strengthening exercises at least twice a week. This is in addition to the moderate-intensity exercise.  Spend less time sitting. Even light physical activity can be beneficial. Watch cholesterol and blood lipids Have your blood tested for lipids and cholesterol at 74 years of age, then have this test every 5 years.  Have your cholesterol levels checked more often if:  Your lipid or cholesterol levels are high.  You are older than 75 years of age.  You are at high risk for heart disease. What should I know about cancer screening? Depending on your health history and family history, you may need to have cancer screening at various ages. This may include screening for:  Breast cancer.  Cervical cancer.  Colorectal cancer.  Skin cancer.  Lung cancer. What should I know about heart disease, diabetes, and high blood pressure? Blood pressure and heart disease  High blood pressure causes heart disease and increases the risk of stroke. This is more likely to develop in people who have high blood pressure readings, are of African descent, or are overweight.  Have your blood pressure checked: ? Every 3-5 years if you are 46-26 years of age. ? Every year if you are 31 years old or  older. Diabetes Have regular diabetes screenings. This checks your fasting blood sugar level. Have the screening done:  Once every three years after age 6 if you are at a normal weight and have a low risk for diabetes.  More often and at a younger age if you are overweight or have a high risk for diabetes. What should I know about preventing infection? Hepatitis B If you have a higher risk for hepatitis B, you should be screened for this virus. Talk with your health care provider to find out if you are at risk for hepatitis B infection. Hepatitis C Testing is recommended for:  Everyone born from [birth year] through 1964.  Anyone with known risk factors for hepatitis C. Sexually transmitted infections (STIs)  Get screened for STIs, including gonorrhea and chlamydia, if: ? You are sexually active and are younger than 74 years of age. ? You are older than 74 years of age and your health care provider tells you that you are at risk for this type of infection. ? Your sexual activity has changed since you were last screened, and you are at increased risk for chlamydia or gonorrhea. Ask your health care provider if you are at risk.  Ask your health care provider about whether you are at high risk for HIV. Your health care provider may recommend a prescription medicine to help prevent HIV infection. If you choose to take medicine to prevent HIV, you should first get tested for HIV. You should then be tested every 3 months for as long as you are taking the medicine. Pregnancy  If you are about to stop having your period (premenopausal) and you may become pregnant, seek counseling before you get pregnant.  Take 400 to 800 micrograms (mcg) of folic acid every day if you become pregnant.  Ask for birth control (contraception) if you want to prevent pregnancy. Osteoporosis and menopause Osteoporosis is a disease in which the bones lose minerals and strength with aging. This can result in bone fractures.  If you are 79 years old or older, or if you are at risk for osteoporosis and fractures, ask your health care provider if you should:  Be screened for bone loss.  Take a calcium or vitamin D supplement to lower your risk of fractures.  Be given hormone replacement therapy (HRT) to treat symptoms of menopause. Follow these instructions at home: Lifestyle  Do not use any products that contain nicotine or tobacco, such as cigarettes, e-cigarettes, and chewing tobacco. If you need help quitting, ask your health care provider.  Do not use street drugs.  Do not share needles.  Ask your health care provider for help if you need support or information about quitting drugs. Alcohol use  Do not drink alcohol if: ? Your health care provider tells you not to drink. ? You are pregnant, may be pregnant, or are planning to become pregnant.  If you drink alcohol: ? Limit how much you use to 0-1 drink a day. ? Limit intake if you are breastfeeding.  Be aware of how much alcohol is in your drink. In the [place] one drink equals one 12 oz bottle of beer (355 mL), one 5 oz glass of wine (148 mL), or one 1 oz glass of hard liquor (44 mL). General instructions  Schedule regular health, dental, and eye exams.  Stay current with your vaccines.  Tell your health care provider if: ? You often feel depressed. ? You have ever been abused or do not feel safe at home. Summary  Adopting a healthy lifestyle and getting preventive care are important in promoting health and wellness.  Follow your health care provider's instructions about healthy diet, exercising, and getting tested or screened for diseases.  Follow your health care provider's instructions on monitoring your cholesterol and blood pressure. This information is not intended to replace advice given to you by your health care provider. Make sure you discuss any questions you have with your health care provider. Document Revised: 04/18/2018  Document Reviewed: 04/18/2018 Elsevier Patient Education  2021 [employer].

## 2020-07-07 NOTE — Progress Notes (Signed)
Established patient visit   Patient: Megan Cervantes   DOB: [birth year]   75 y.o. Female  MRN: [id number] Visit Date: 07/07/2020  Today's healthcare provider: Marcille Buffy, FNP   Chief Complaint  Patient presents with  . Follow-up   Subjective    HPI  Prediabetes, Follow-up  Lab Results  Component Value Date   HGBA1C 6.1 (H) 11/06/2018   HGBA1C 5.9 (H) 08/15/2017   GLUCOSE 85 12/17/2018   GLUCOSE 117 (H) 11/06/2018   GLUCOSE 84 08/15/2017    Last seen for for this 18 months ago.  Management since that visit includes none labs ordered. Current symptoms include none and have been unchanged.  Prior visit with dietician: no Current diet: well balanced Current exercise: no regular exercise  Pertinent Labs:    Component Value Date/Time   CHOL 201 (H) 11/06/2018 0813   TRIG 235 (H) 11/06/2018 0813   CHOLHDL 3.7 11/06/2018 0813   CHOLHDL 3.0 01/31/2017 1149   CREATININE 0.86 12/17/2018 1343    Wt Readings from Last 3 Encounters:  07/07/20 151 lb 9.6 oz (68.8 kg)  12/13/18 145 lb 11.2 oz (66.1 kg)  10/31/18 150 lb (68 kg)    ----------------------------------------------------------------------------------------- Lipid/Cholesterol, Follow-up  Last lipid panel Other pertinent labs  Lab Results  Component Value Date   CHOL 201 (H) 11/06/2018   HDL 55 11/06/2018   LDLCALC 99 11/06/2018   TRIG 235 (H) 11/06/2018   CHOLHDL 3.7 11/06/2018   Lab Results  Component Value Date   ALT 26 11/06/2018   AST 34 11/06/2018   PLT 245 11/06/2018     She was last seen for this 18 months ago.  Management since that visit includes non continue to work on diet and exercise.   Symptoms: No chest pain No chest pressure/discomfort  No dyspnea No lower extremity edema  No numbness or tingling of extremity No orthopnea  No palpitations No paroxysmal nocturnal dyspnea  No speech difficulty No syncope   Current diet: well balanced Current exercise: no  regular exercise  The 10-year ASCVD risk score Mikey Bussing DC Jr., et al., 2013) is: 13.8%  --------------------------------------------------------------------------------------------------- GERD, Follow up:  The patient was last seen for GERD 18 months ago. Changes made since that visit include discontinuing Nexium and starting patient on Protonix 40mg .Patient states that she is on Omeprazole She is out of her medication Omeprazole and requesting refill. Denies any symptoms.  She reports good compliance with treatment. She is not having side effects. .  She IS experiencing no symptoms present.  Denies any urinary infection symptoms or hematuria. Denies abdominal pain.  Patient  denies any fever, body aches,chills, rash, chest pain, shortness of breath, nausea, vomiting, or diarrhea.  Denies dizziness, lightheadedness, pre syncopal or syncopal episodes.    -----------------------------------------------------------------------------------------  Patient Active Problem List   Diagnosis Date Noted  . Elevated glucose 08/15/2017  . Hypercholesterolemia 07/20/2016  . Arthritis 05/23/2016  . Diverticulosis 05/23/2016  . GERD (gastroesophageal reflux disease) 05/23/2016   Past Medical History:  Diagnosis Date  . Hyperlipidemia    Allergies  Allergen Reactions  . Azithromycin Diarrhea and Nausea Only  . Bactrim [Sulfamethoxazole-Trimethoprim] Nausea Only  . Cephalexin Other (See Comments)    Muscle cramps  . Ciprofloxacin Other (See Comments)    Leg cramps  . Macrobid [Nitrofurantoin] Other (See Comments)    Muscle cramps  . Penicillin G Rash       Medications: Outpatient Medications Prior to Visit  Medication Sig  .  BIOTIN PO Take by mouth daily.   . Cholecalciferol (VITAMIN D) 2000 units CAPS Take 1 capsule by mouth daily.  Marland Kitchen CRANBERRY CONCENTRATE PO Take by mouth daily.   Marland Kitchen ibuprofen (ADVIL) 800 MG tablet Take 1 tablet (800 mg total) by mouth every 8 (eight) hours as  needed.  . magnesium oxide (MAG-OX) 400 MG tablet Take 1 tablet (400 mg total) by mouth daily.  . meloxicam (MOBIC) 7.5 MG tablet Take 7.5 mg by mouth daily.   . Misc Natural Products (GLUCOSAMINE CHOND COMPLEX/MSM PO) Take 1 tablet by mouth daily.  . Multiple Vitamins-Minerals (CENTRUM SILVER PO) Take 1 tablet by mouth daily.  . polyethylene glycol powder (GLYCOLAX/MIRALAX) powder Take 17 g by mouth 2 (two) times daily as needed.  . potassium chloride (K-DUR) 10 MEQ tablet Take 1 tablet (10 mEq total) by mouth at bedtime.  . TURMERIC PO Take by mouth daily.   . vitamin B-12 (CYANOCOBALAMIN) 1000 MCG tablet Take 1,000 mcg by mouth daily.  . vitamin C (ASCORBIC ACID) 250 MG tablet Take 250 mg by mouth daily.  . [DISCONTINUED] omeprazole (PRILOSEC) 40 MG capsule TAKE 1 CAPSULE(40 MG) BY MOUTH DAILY  . pseudoephedrine-acetaminophen (TYLENOL SINUS) 30-500 MG TABS tablet Take 1 tablet by mouth every 4 (four) hours as needed. (Patient not taking: Reported on 07/07/2020)  . [DISCONTINUED] amoxicillin-clavulanate (AUGMENTIN) 875-125 MG tablet Take 1 tablet by mouth 2 (two) times daily. (Patient not taking: Reported on 07/07/2020)  . [DISCONTINUED] nitrofurantoin, macrocrystal-monohydrate, (MACROBID) 100 MG capsule Take 1 capsule (100 mg total) by mouth 2 (two) times daily. (Patient not taking: Reported on 07/07/2020)   No facility-administered medications prior to visit.    Review of Systems  Constitutional: Negative.   HENT: Negative.   Respiratory: Negative.   Cardiovascular: Negative.   Gastrointestinal: Negative.   Genitourinary: Negative.   Musculoskeletal: Positive for arthralgias (chronic. ). Negative for back pain, gait problem, joint swelling, myalgias, neck pain and neck stiffness.  Neurological: Negative.   Hematological: Negative.   Psychiatric/Behavioral: Negative.     Last CBC Lab Results  Component Value Date   WBC 5.0 11/06/2018   HGB 12.7 11/06/2018   HCT 37.9 11/06/2018   MCV  101 (H) 11/06/2018   MCH 33.9 (H) 11/06/2018   RDW 12.8 11/06/2018   PLT 245 59/56/3875   Last metabolic panel Lab Results  Component Value Date   GLUCOSE 85 12/17/2018   NA 141 12/17/2018   K 4.8 12/17/2018   CL 100 12/17/2018   CO2 23 12/17/2018   BUN 19 12/17/2018   CREATININE 0.86 12/17/2018   GFRNONAA 68 12/17/2018   GFRAA 79 12/17/2018   CALCIUM 10.2 12/17/2018   PROT 6.8 11/06/2018   ALBUMIN 4.5 11/06/2018   LABGLOB 2.3 11/06/2018   AGRATIO 2.0 11/06/2018   BILITOT 0.2 11/06/2018   ALKPHOS 89 11/06/2018   AST 34 11/06/2018   ALT 26 11/06/2018   Last lipids Lab Results  Component Value Date   CHOL 201 (H) 11/06/2018   HDL 55 11/06/2018   LDLCALC 99 11/06/2018   TRIG 235 (H) 11/06/2018   CHOLHDL 3.7 11/06/2018   Last hemoglobin A1c Lab Results  Component Value Date   HGBA1C 6.1 (H) 11/06/2018   Last thyroid functions No results found for: TSH, T3TOTAL, T4TOTAL, THYROIDAB Last vitamin D No results found for: 25OHVITD2, 25OHVITD3, VD25OH Last vitamin B12 and Folate No results found for: VITAMINB12, FOLATE     Objective    BP 132/80   Pulse 79  Temp 97.7 F (36.5 C) (Oral)   Resp 16   Wt 151 lb 9.6 oz (68.8 kg)   SpO2 100%   BMI 24.47 kg/m  BP Readings from Last 3 Encounters:  07/07/20 132/80  12/13/18 (!) 133/99  10/31/18 130/86   Wt Readings from Last 3 Encounters:  07/07/20 151 lb 9.6 oz (68.8 kg)  12/13/18 145 lb 11.2 oz (66.1 kg)  10/31/18 150 lb (68 kg)       Physical Exam Vitals reviewed.  Constitutional:      Appearance: Normal appearance.  HENT:     Head: Normocephalic.     Right Ear: External ear normal.     Left Ear: External ear normal.     Nose: Nose normal. No congestion or rhinorrhea.     Mouth/Throat:     Mouth: Mucous membranes are moist.  Eyes:     General: No scleral icterus.       Right eye: No discharge.        Left eye: No discharge.     Pupils: Pupils are equal, round, and reactive to light.   Cardiovascular:     Rate and Rhythm: Normal rate and regular rhythm.     Pulses: Normal pulses.     Heart sounds: Normal heart sounds. No murmur heard. No friction rub. No gallop.   Pulmonary:     Effort: Pulmonary effort is normal.     Breath sounds: Normal breath sounds.  Abdominal:     General: There is no distension.     Palpations: Abdomen is soft.     Tenderness: There is no abdominal tenderness.  Musculoskeletal:        General: Normal range of motion.  Skin:    General: Skin is warm.     Findings: No erythema or rash.  Neurological:     Mental Status: She is alert and oriented to person, place, and time.  Psychiatric:        Mood and Affect: Mood normal.        Behavior: Behavior normal.        Thought Content: Thought content normal.        Judgment: Judgment normal.     No results found for any visits on 07/07/20.  Assessment & Plan     Hypercholesterolemia - Plan: Lipid Panel w/o Chol/HDL Ratio, TSH  Gastroesophageal reflux disease without esophagitis - Plan: CBC with Differential/Platelet, Comprehensive metabolic panel, Lipid Panel w/o Chol/HDL Ratio, omeprazole (PRILOSEC) 40 MG capsule, TSH, DISCONTINUED: omeprazole (PRILOSEC) 40 MG capsule  Elevated glucose - Plan: HgB A1c, TSH  Arthritis - Plan: TSH  She came in today for refill on her GERD medication, chronic condition follow up and stable. She is  Overdue for labs. Had annual wellness nurse visit but no annual visit seen.  Will scheduled.   Fasting lab orders placed.    Meds ordered this encounter  Medications  . DISCONTD: omeprazole (PRILOSEC) 40 MG capsule    Sig: TAKE 1 CAPSULE(40 MG) BY MOUTH DAILY    Dispense:  90 capsule    Refill:  0    **Patient requests 90 days supply**  . omeprazole (PRILOSEC) 40 MG capsule    Sig: TAKE 1 CAPSULE(40 MG) BY MOUTH DAILY    Dispense:  90 capsule    Refill:  1    **Patient requests 90 days supply**    Red Flags discussed. The patient was given clear  instructions to go to ER or return to medical center  if any red flags develop, symptoms do not improve, worsen or new problems develop. They verbalized understanding.   Return in 1 month (on 08/07/2020), or if symptoms worsen or fail to improve, for at any time for any worsening symptoms, Go to Emergency room/ urgent care if worse.     The entirety of the information documented in the History of Present Illness, Review of Systems and Physical Exam were personally obtained by me. Portions of this information were initially documented by the CMA and reviewed by me for thoroughness and accuracy.     Marcille Buffy, [place] [phone] (phone) [phone] (fax)  [place]

## 2020-07-09 DIAGNOSIS — K219 Gastro-esophageal reflux disease without esophagitis: Secondary | ICD-10-CM | POA: Diagnosis not present

## 2020-07-09 DIAGNOSIS — R7309 Other abnormal glucose: Secondary | ICD-10-CM | POA: Diagnosis not present

## 2020-07-09 DIAGNOSIS — E7800 Pure hypercholesterolemia, unspecified: Secondary | ICD-10-CM | POA: Diagnosis not present

## 2020-07-10 LAB — TSH: TSH: 1.63 u[IU]/mL (ref 0.450–4.500)

## 2020-07-10 LAB — COMPREHENSIVE METABOLIC PANEL
ALT: 33 IU/L — ABNORMAL HIGH (ref 0–32)
AST: 29 IU/L (ref 0–40)
Albumin/Globulin Ratio: 1.6 (ref 1.2–2.2)
Albumin: 4.6 g/dL (ref 3.7–4.7)
Alkaline Phosphatase: 122 IU/L — ABNORMAL HIGH (ref 44–121)
BUN/Creatinine Ratio: 32 — ABNORMAL HIGH (ref 12–28)
BUN: 22 mg/dL (ref 8–27)
Bilirubin Total: 0.3 mg/dL (ref 0.0–1.2)
CO2: 21 mmol/L (ref 20–29)
Calcium: 10.3 mg/dL (ref 8.7–10.3)
Chloride: 103 mmol/L (ref 96–106)
Creatinine, Ser: 0.68 mg/dL (ref 0.57–1.00)
Globulin, Total: 2.8 g/dL (ref 1.5–4.5)
Glucose: 116 mg/dL — ABNORMAL HIGH (ref 65–99)
Potassium: 5.3 mmol/L — ABNORMAL HIGH (ref 3.5–5.2)
Sodium: 140 mmol/L (ref 134–144)
Total Protein: 7.4 g/dL (ref 6.0–8.5)
eGFR: 92 mL/min/{1.73_m2} (ref >59)

## 2020-07-10 LAB — CBC WITH DIFFERENTIAL/PLATELET
Basophils Absolute: 0 10*3/uL (ref 0.0–0.2)
Basos: 1 %
EOS (ABSOLUTE): 0.1 10*3/uL (ref 0.0–0.4)
Eos: 1 %
Hematocrit: 40.1 % (ref 34.0–46.6)
Hemoglobin: 13.5 g/dL (ref 11.1–15.9)
Immature Grans (Abs): 0 10*3/uL (ref 0.0–0.1)
Immature Granulocytes: 0 %
Lymphocytes Absolute: 1.5 10*3/uL (ref 0.7–3.1)
Lymphs: 21 %
MCH: 32.2 pg (ref 26.6–33.0)
MCHC: 33.7 g/dL (ref 31.5–35.7)
MCV: 96 fL (ref 79–97)
Monocytes Absolute: 0.5 10*3/uL (ref 0.1–0.9)
Monocytes: 7 %
Neutrophils Absolute: 5.1 10*3/uL (ref 1.4–7.0)
Neutrophils: 70 %
Platelets: 242 10*3/uL (ref 150–450)
RBC: 4.19 x10E6/uL (ref 3.77–5.28)
RDW: 12.3 % (ref 11.7–15.4)
WBC: 7.2 10*3/uL (ref 3.4–10.8)

## 2020-07-10 LAB — LIPID PANEL W/O CHOL/HDL RATIO
Cholesterol, Total: 193 mg/dL (ref 100–199)
HDL: 52 mg/dL (ref >39)
LDL Chol Calc (NIH): 111 mg/dL — ABNORMAL HIGH (ref 0–99)
Triglycerides: 174 mg/dL — ABNORMAL HIGH (ref 0–149)
VLDL Cholesterol Cal: 30 mg/dL (ref 5–40)

## 2020-07-10 LAB — HEMOGLOBIN A1C
Est. average glucose Bld gHb Est-mCnc: 137 mg/dL
Hgb A1c MFr Bld: 6.4 % — ABNORMAL HIGH (ref 4.8–5.6)

## 2020-07-10 NOTE — Progress Notes (Signed)
CBC within normal limits.  CMP elevated glucose and Hemoglobin A1C is now 6.4 at diabetic range, really need to watch diet and increase exercise lifestyle changes, needs follow up in 3 months and may need medication at that time. Otherwise CMP is stable.  Triglycerides and LDL elevated.  Discuss lifestyle modification with patient e.g. increase exercise, fiber, fruits, vegetables, lean meat, and omega 3/fish intake and decrease saturated fat.  If patient following strict diet and exercise program already please schedule follow up appointment with primary care physician TSH is within normal limits.

## 2020-07-13 DIAGNOSIS — Z961 Presence of intraocular lens: Secondary | ICD-10-CM | POA: Diagnosis not present

## 2020-09-29 DIAGNOSIS — R7989 Other specified abnormal findings of blood chemistry: Secondary | ICD-10-CM | POA: Diagnosis not present

## 2020-09-29 DIAGNOSIS — M159 Polyosteoarthritis, unspecified: Secondary | ICD-10-CM | POA: Diagnosis not present

## 2020-09-29 DIAGNOSIS — M79641 Pain in right hand: Secondary | ICD-10-CM | POA: Diagnosis not present

## 2020-09-29 DIAGNOSIS — M79642 Pain in left hand: Secondary | ICD-10-CM | POA: Diagnosis not present

## 2020-09-29 DIAGNOSIS — M1991 Primary osteoarthritis, unspecified site: Secondary | ICD-10-CM | POA: Diagnosis not present

## 2020-09-29 DIAGNOSIS — M158 Other polyosteoarthritis: Secondary | ICD-10-CM | POA: Diagnosis not present

## 2020-09-29 DIAGNOSIS — M1990 Unspecified osteoarthritis, unspecified site: Secondary | ICD-10-CM | POA: Diagnosis not present

## 2020-10-06 ENCOUNTER — Telehealth: Payer: Self-pay

## 2020-10-06 DIAGNOSIS — K219 Gastro-esophageal reflux disease without esophagitis: Secondary | ICD-10-CM

## 2020-10-06 MED ORDER — OMEPRAZOLE 40 MG PO CPDR: DELAYED_RELEASE_CAPSULE | ORAL | 1 refills | Status: DC

## 2020-10-06 NOTE — Telephone Encounter (Signed)
[facility] faxed refill request for the following medications:  omeprazole (PRILOSEC) 40 MG capsule  Please advise.

## 2020-10-08 ENCOUNTER — Other Ambulatory Visit: Payer: Self-pay | Admitting: Adult Health

## 2020-10-08 ENCOUNTER — Telehealth: Payer: Self-pay | Admitting: Physician Assistant

## 2020-10-08 DIAGNOSIS — K219 Gastro-esophageal reflux disease without esophagitis: Secondary | ICD-10-CM

## 2020-10-08 MED ORDER — OMEPRAZOLE 40 MG PO CPDR: DELAYED_RELEASE_CAPSULE | ORAL | 0 refills | Status: DC

## 2020-10-08 NOTE — Telephone Encounter (Signed)
Meds ordered this encounter  Medications   omeprazole (PRILOSEC) 40 MG capsule    Sig: TAKE 1 CAPSULE(40 MG) BY MOUTH DAILY    Dispense:  90 capsule    Refill:  0  Provider sent refill as above, if having new or worsening/ changing symptoms she should be seen immediately.  If stable keep TOC appointment advised.

## 2020-10-08 NOTE — Progress Notes (Signed)
No orders of the defined types were placed in this encounter.  Meds ordered this encounter  Medications  . omeprazole (PRILOSEC) 40 MG capsule    Sig: TAKE 1 CAPSULE(40 MG) BY MOUTH DAILY    Dispense:  90 capsule    Refill:  0  refill request.

## 2020-10-08 NOTE — Telephone Encounter (Signed)
PT called in to request a new prescription for their omeprazole (PRILOSEC) 40 MG capsule. PT last saw Laverna Peace on 07/07/2020 does have a upcoming TOC appt on July 6.

## 2020-10-13 DIAGNOSIS — M79642 Pain in left hand: Secondary | ICD-10-CM | POA: Diagnosis not present

## 2020-10-13 DIAGNOSIS — M159 Polyosteoarthritis, unspecified: Secondary | ICD-10-CM | POA: Diagnosis not present

## 2020-10-13 DIAGNOSIS — M0579 Rheumatoid arthritis with rheumatoid factor of multiple sites without organ or systems involvement: Secondary | ICD-10-CM | POA: Diagnosis not present

## 2020-10-13 DIAGNOSIS — M1990 Unspecified osteoarthritis, unspecified site: Secondary | ICD-10-CM | POA: Diagnosis not present

## 2020-10-13 DIAGNOSIS — M79641 Pain in right hand: Secondary | ICD-10-CM | POA: Diagnosis not present

## 2020-11-10 ENCOUNTER — Encounter: Payer: Self-pay | Admitting: Family Medicine

## 2020-11-11 ENCOUNTER — Encounter: Payer: Medicare Other | Admitting: Adult Health

## 2020-12-09 ENCOUNTER — Ambulatory Visit
Admission: EM | Admit: 2020-12-09 | Discharge: 2020-12-09 | Disposition: A | Discharge status: Home / Self Care | Payer: Medicare Other | Attending: Emergency Medicine | Admitting: Emergency Medicine

## 2020-12-09 ENCOUNTER — Other Ambulatory Visit: Payer: Self-pay

## 2020-12-09 DIAGNOSIS — R030 Elevated blood-pressure reading, without diagnosis of hypertension: Secondary | ICD-10-CM | POA: Diagnosis not present

## 2020-12-09 DIAGNOSIS — N390 Urinary tract infection, site not specified: Secondary | ICD-10-CM | POA: Insufficient documentation

## 2020-12-09 LAB — POCT URINALYSIS DIP (MANUAL ENTRY)
Bilirubin, UA: NEGATIVE
Glucose, UA: NEGATIVE mg/dL
Ketones, POC UA: NEGATIVE mg/dL
Nitrite, UA: NEGATIVE
Protein Ur, POC: NEGATIVE mg/dL
Spec Grav, UA: 1.025 (ref 1.010–1.025)
Urobilinogen, UA: 0.2 E.U./dL
pH, UA: 5.5 (ref 5.0–8.0)

## 2020-12-09 MED ORDER — AMOXICILLIN-POT CLAVULANATE 875-125 MG PO TABS: 1.0000 | ORAL_TABLET | Freq: Two times a day (BID) | ORAL | 0 refills | Status: AC

## 2020-12-09 NOTE — ED Triage Notes (Signed)
Patient presents to Urgent Care with complaints of possible UTI. Patient states she developed increased frequency since today. Pt states she has a hx of UTI.   Denies fever, abdominal pain, or hematuria.

## 2020-12-09 NOTE — ED Provider Notes (Signed)
Megan Cervantes    CSN: [id number] Arrival date & time: 12/09/20  1448      History   Chief Complaint Chief Complaint  Patient presents with   Urinary Frequency    HPI Megan Cervantes is a 75 y.o. female.  Patient presents with dysuria, frequency, urgency since this morning.  She denies fever, chills, abdominal pain, vaginal discharge, pelvic pain, back pain, or other symptoms.  No treatments attempted at home.  She reports history of UTI last year that was treated with Augmentin.  Her medical history includes arthritis, GERD, diverticulosis, hyperlipidemia, elevated glucose.  The history is provided by the patient and medical records.   Past Medical History:  Diagnosis Date   Hyperlipidemia     Patient Active Problem List   Diagnosis Date Noted   Elevated glucose 08/15/2017   Hypercholesterolemia 07/20/2016   Arthritis 05/23/2016   Diverticulosis 05/23/2016   GERD (gastroesophageal reflux disease) 05/23/2016    Past Surgical History:  Procedure Laterality Date   ABDOMINAL HYSTERECTOMY  1986   total   APPENDECTOMY     BIOPSY THYROID Bilateral    BREAST CYST ASPIRATION Left 1990's   CHOLECYSTECTOMY  90's   diverticulisitis  2016   removed about a foot of colon   EYE SURGERY Bilateral 2010   cataract   HERNIA REPAIR  11/2015   mesh covers large portion of stomach    OB History   No obstetric history on file.      Home Medications    Prior to Admission medications   Medication Sig Start Date End Date Taking? Authorizing Provider  amoxicillin-clavulanate (AUGMENTIN) 875-125 MG tablet Take 1 tablet by mouth every 12 (twelve) hours. 12/09/20  Yes Sharion Balloon, NP  folic acid (FOLVITE) 1 MG tablet Take by mouth. 10/13/20 10/13/21 Yes [provider]  methotrexate (RHEUMATREX) 2.5 MG tablet Take by mouth. 10/14/20  Yes [provider]  BIOTIN PO Take by mouth daily.     [provider]  Cholecalciferol (VITAMIN D) 2000 units CAPS Take  1 capsule by mouth daily.    [provider]  CRANBERRY CONCENTRATE PO Take by mouth daily.     [provider]  CRANBERRY EXTRACT PO Take by mouth.    [provider]  ibuprofen (ADVIL) 800 MG tablet Take 1 tablet (800 mg total) by mouth every 8 (eight) hours as needed. 10/31/18   Mar Daring, PA-C  magnesium oxide (MAG-OX) 400 MG tablet Take 1 tablet (400 mg total) by mouth daily. 12/31/18   Mar Daring, PA-C  meloxicam (MOBIC) 7.5 MG tablet Take 7.5 mg by mouth daily.  10/01/19   [provider]  Misc Natural Products (GLUCOSAMINE CHOND COMPLEX/MSM PO) Take 1 tablet by mouth daily.    [provider]  Multiple Vitamins-Minerals (CENTRUM SILVER PO) Take 1 tablet by mouth daily.    [provider]  omeprazole (PRILOSEC) 40 MG capsule TAKE 1 CAPSULE(40 MG) BY MOUTH DAILY 10/08/20   Flinchum, Kelby Aline, FNP  polyethylene glycol powder (GLYCOLAX/MIRALAX) powder Take 17 g by mouth 2 (two) times daily as needed. 01/09/18   Mar Daring, PA-C  potassium chloride (K-DUR) 10 MEQ tablet Take 1 tablet (10 mEq total) by mouth at bedtime. 12/31/18   Mar Daring, PA-C  pseudoephedrine-acetaminophen (TYLENOL SINUS) 30-500 MG TABS tablet Take 1 tablet by mouth every 4 (four) hours as needed. Patient not taking: Reported on 07/07/2020    [provider]  TURMERIC PO  Take by mouth daily.     [provider]  vitamin B-12 (CYANOCOBALAMIN) 1000 MCG tablet Take 1,000 mcg by mouth daily.    [provider]  vitamin C (ASCORBIC ACID) 250 MG tablet Take 250 mg by mouth daily.    [provider]    Family History Family History  Problem Relation Age of Onset   Breast cancer Neg Hx     Social History Social History   Tobacco Use   Smoking status: Never   Smokeless tobacco: Never  Vaping Use   Vaping Use: Never used  Substance Use Topics   Alcohol use: No   Drug use: No     Allergies    Azithromycin, Bactrim [sulfamethoxazole-trimethoprim], Cephalexin, Ciprofloxacin, Macrobid [nitrofurantoin], and Penicillin g   Review of Systems Review of Systems  Constitutional:  Negative for chills and fever.  Respiratory:  Negative for cough and shortness of breath.   Cardiovascular:  Negative for chest pain and palpitations.  Gastrointestinal:  Negative for abdominal pain and vomiting.  Genitourinary:  Positive for dysuria, frequency and urgency. Negative for flank pain, hematuria, pelvic pain and vaginal discharge.  Skin:  Negative for color change and rash.  All other systems reviewed and are negative.   Physical Exam Triage Vital Signs ED Triage Vitals  Enc Vitals Group     BP      Pulse      Resp      Temp      Temp src      SpO2      Weight      Height      Head Circumference      Peak Flow      Pain Score      Pain Loc      Pain Edu?      Excl. in [place]?    No data found.  Updated Vital Signs BP (S) (!) 161/100 (BP Location: Left Arm)   Pulse 69   Temp 98.2 F (36.8 C) (Temporal)   Resp 16   Wt 147 lb (66.7 kg)   SpO2 97%   BMI 23.73 kg/m   Visual Acuity Right Eye Distance:   Left Eye Distance:   Bilateral Distance:    Right Eye Near:   Left Eye Near:    Bilateral Near:     Physical Exam Vitals and nursing note reviewed.  Constitutional:      General: She is not in acute distress.    Appearance: She is well-developed. She is not ill-appearing.  HENT:     Head: Normocephalic and atraumatic.     Mouth/Throat:     Mouth: Mucous membranes are moist.  Eyes:     Conjunctiva/sclera: Conjunctivae normal.  Cardiovascular:     Rate and Rhythm: Normal rate and regular rhythm.     Heart sounds: Normal heart sounds.  Pulmonary:     Effort: Pulmonary effort is normal. No respiratory distress.     Breath sounds: Normal breath sounds.  Abdominal:     Palpations: Abdomen is soft.     Tenderness: There is no abdominal tenderness.  Musculoskeletal:      Cervical back: Neck supple.  Skin:    General: Skin is warm and dry.  Neurological:     General: No focal deficit present.     Mental Status: She is alert and oriented to person, place, and time.     Gait: Gait normal.  Psychiatric:  Mood and Affect: Mood normal.        Behavior: Behavior normal.     UC Treatments / Results  Labs (all labs ordered are listed, but only abnormal results are displayed) Labs Reviewed  POCT URINALYSIS DIP (MANUAL ENTRY) - Abnormal; Notable for the following components:      Result Value   Clarity, UA cloudy (*)    Blood, UA trace-intact (*)    Leukocytes, UA Moderate (2+) (*)    All other components within normal limits  URINE CULTURE    EKG   Radiology No results found.  Procedures Procedures (including critical care time)  Medications Ordered in UC Medications - No data to display  Initial Impression / Assessment and Plan / UC Course  I have reviewed the triage vital signs and the nursing notes.  Pertinent labs & imaging results that were available during my care of the patient were reviewed by me and considered in my medical decision making (see chart for details).  UTI, elevated blood pressure.  Urine culture in 2020 is the most recent available in patient's chart; Klebsiella variicola.  Patient has allergies listed to multiple antibiotics.  She states she is able to take amoxicillin without any difficulty; she has a penicillin allergy listed in her chart but states this was as a child and she has not had any difficulty as an adult.  Treating today with Augmentin.  Urine culture pending.  Discussed with patient we will call her if the culture shows the need to change or discontinue the antibiotic.  Education provided on UTIs.  Also discussed with patient that her blood pressure is elevated today and needs to be rechecked by her PCP in 2 to 4 weeks.  Patient states she does not have hypertension but her blood pressure is usually  elevated when she is in a doctor's office due to "whitecoat syndrome."  Patient agrees to plan of care.   Final Clinical Impressions(s) / UC Diagnoses   Final diagnoses:  Urinary tract infection without hematuria, site unspecified  Elevated blood pressure reading     Discharge Instructions      Take the antibiotic as directed.  The urine culture is pending.  We will call you if it shows the need to change or discontinue your antibiotic.    Your blood pressure is elevated today at 166/89; repeat 161/100.  Please have this rechecked by your primary care provider in 2-4 weeks.          ED Prescriptions     Medication Sig Dispense Auth. Provider   amoxicillin-clavulanate (AUGMENTIN) 875-125 MG tablet Take 1 tablet by mouth every 12 (twelve) hours. 14 tablet Sharion Balloon, NP      PDMP not reviewed this encounter.   Sharion Balloon, NP 12/09/20 [phone]

## 2020-12-09 NOTE — Discharge Instructions (Addendum)
Take the antibiotic as directed.  The urine culture is pending.  We will call you if it shows the need to change or discontinue your antibiotic.    Your blood pressure is elevated today at 166/89; repeat 161/100.  Please have this rechecked by your primary care provider in 2-4 weeks.

## 2020-12-12 LAB — URINE CULTURE: Culture: >=100000 — AB

## 2020-12-16 DIAGNOSIS — M0579 Rheumatoid arthritis with rheumatoid factor of multiple sites without organ or systems involvement: Secondary | ICD-10-CM | POA: Diagnosis not present

## 2020-12-16 DIAGNOSIS — M79641 Pain in right hand: Secondary | ICD-10-CM | POA: Diagnosis not present

## 2020-12-16 DIAGNOSIS — M79642 Pain in left hand: Secondary | ICD-10-CM | POA: Diagnosis not present

## 2020-12-22 DIAGNOSIS — M79642 Pain in left hand: Secondary | ICD-10-CM | POA: Diagnosis not present

## 2020-12-22 DIAGNOSIS — M79641 Pain in right hand: Secondary | ICD-10-CM | POA: Diagnosis not present

## 2020-12-22 DIAGNOSIS — M0579 Rheumatoid arthritis with rheumatoid factor of multiple sites without organ or systems involvement: Secondary | ICD-10-CM | POA: Diagnosis not present

## 2020-12-22 DIAGNOSIS — M159 Polyosteoarthritis, unspecified: Secondary | ICD-10-CM | POA: Diagnosis not present

## 2020-12-30 ENCOUNTER — Other Ambulatory Visit: Payer: Self-pay | Admitting: Adult Health

## 2020-12-30 DIAGNOSIS — R921 Mammographic calcification found on diagnostic imaging of breast: Secondary | ICD-10-CM

## 2021-01-06 ENCOUNTER — Encounter: Payer: Medicare Other | Admitting: Adult Health

## 2021-02-01 ENCOUNTER — Ambulatory Visit
Admission: RE | Admit: 2021-02-01 | Discharge: 2021-02-01 | Disposition: A | Discharge status: Home / Self Care | Payer: Medicare Other | Source: Ambulatory Visit | Attending: Adult Health | Admitting: Adult Health

## 2021-02-01 ENCOUNTER — Other Ambulatory Visit: Payer: Self-pay | Admitting: Adult Health

## 2021-02-01 ENCOUNTER — Other Ambulatory Visit: Payer: Self-pay

## 2021-02-01 ENCOUNTER — Other Ambulatory Visit: Payer: Self-pay | Admitting: Family

## 2021-02-01 DIAGNOSIS — R922 Inconclusive mammogram: Secondary | ICD-10-CM | POA: Diagnosis not present

## 2021-02-01 DIAGNOSIS — R921 Mammographic calcification found on diagnostic imaging of breast: Secondary | ICD-10-CM | POA: Insufficient documentation

## 2021-02-01 DIAGNOSIS — R928 Other abnormal and inconclusive findings on diagnostic imaging of breast: Secondary | ICD-10-CM

## 2021-02-02 ENCOUNTER — Other Ambulatory Visit: Payer: Self-pay | Admitting: Family

## 2021-02-02 DIAGNOSIS — R921 Mammographic calcification found on diagnostic imaging of breast: Secondary | ICD-10-CM

## 2021-02-02 DIAGNOSIS — R928 Other abnormal and inconclusive findings on diagnostic imaging of breast: Secondary | ICD-10-CM

## 2021-02-03 ENCOUNTER — Other Ambulatory Visit: Payer: Self-pay

## 2021-02-03 ENCOUNTER — Ambulatory Visit
Admission: RE | Admit: 2021-02-03 | Discharge: 2021-02-03 | Disposition: A | Discharge status: Home / Self Care | Payer: Medicare Other | Source: Ambulatory Visit | Attending: Family | Admitting: Family

## 2021-02-03 DIAGNOSIS — R928 Other abnormal and inconclusive findings on diagnostic imaging of breast: Secondary | ICD-10-CM | POA: Insufficient documentation

## 2021-02-03 DIAGNOSIS — R921 Mammographic calcification found on diagnostic imaging of breast: Secondary | ICD-10-CM

## 2021-02-03 HISTORY — PX: BREAST BIOPSY: SHX20

## 2021-02-04 LAB — SURGICAL PATHOLOGY

## 2021-02-16 ENCOUNTER — Telehealth: Payer: Self-pay

## 2021-02-16 ENCOUNTER — Encounter: Payer: Medicare Other | Admitting: Adult Health

## 2021-02-16 NOTE — Telephone Encounter (Signed)
Copied from [place] [id number]. Topic: General - Inquiry >> Feb 16, 2021 10:50 AM Loma [place] wrote: Reason for CRM: FU with pt at [phone] She states was Jenni's pt and wants to continue on @  [facility] and would like to get in with one of the new female providers if possible . Anytime this month if you could reach out, leave a message and will get back if no answer

## 2021-02-17 DIAGNOSIS — D2262 Melanocytic nevi of left upper limb, including shoulder: Secondary | ICD-10-CM | POA: Diagnosis not present

## 2021-02-17 DIAGNOSIS — D0439 Carcinoma in situ of skin of other parts of face: Secondary | ICD-10-CM | POA: Diagnosis not present

## 2021-02-17 DIAGNOSIS — Z85828 Personal history of other malignant neoplasm of skin: Secondary | ICD-10-CM | POA: Diagnosis not present

## 2021-02-17 DIAGNOSIS — D2261 Melanocytic nevi of right upper limb, including shoulder: Secondary | ICD-10-CM | POA: Diagnosis not present

## 2021-02-17 DIAGNOSIS — D225 Melanocytic nevi of trunk: Secondary | ICD-10-CM | POA: Diagnosis not present

## 2021-02-17 DIAGNOSIS — D485 Neoplasm of uncertain behavior of skin: Secondary | ICD-10-CM | POA: Diagnosis not present

## 2021-02-17 DIAGNOSIS — D2271 Melanocytic nevi of right lower limb, including hip: Secondary | ICD-10-CM | POA: Diagnosis not present

## 2021-02-22 DIAGNOSIS — M0579 Rheumatoid arthritis with rheumatoid factor of multiple sites without organ or systems involvement: Secondary | ICD-10-CM | POA: Diagnosis not present

## 2021-03-01 DIAGNOSIS — M79641 Pain in right hand: Secondary | ICD-10-CM | POA: Diagnosis not present

## 2021-03-01 DIAGNOSIS — M0579 Rheumatoid arthritis with rheumatoid factor of multiple sites without organ or systems involvement: Secondary | ICD-10-CM | POA: Diagnosis not present

## 2021-03-01 DIAGNOSIS — M159 Polyosteoarthritis, unspecified: Secondary | ICD-10-CM | POA: Diagnosis not present

## 2021-03-10 ENCOUNTER — Ambulatory Visit (INDEPENDENT_AMBULATORY_CARE_PROVIDER_SITE_OTHER): Payer: Medicare Other | Admitting: Physician Assistant

## 2021-03-10 ENCOUNTER — Encounter: Payer: Self-pay | Admitting: Physician Assistant

## 2021-03-10 ENCOUNTER — Other Ambulatory Visit: Payer: Self-pay

## 2021-03-10 VITALS — BP 153/85 | HR 88 | Temp 98.1°F | Ht 66.0 in | Wt 151.8 lb

## 2021-03-10 DIAGNOSIS — E7800 Pure hypercholesterolemia, unspecified: Secondary | ICD-10-CM | POA: Diagnosis not present

## 2021-03-10 DIAGNOSIS — Z13820 Encounter for screening for osteoporosis: Secondary | ICD-10-CM

## 2021-03-10 DIAGNOSIS — R7309 Other abnormal glucose: Secondary | ICD-10-CM

## 2021-03-10 DIAGNOSIS — Z0000 Encounter for general adult medical examination without abnormal findings: Secondary | ICD-10-CM | POA: Diagnosis not present

## 2021-03-10 DIAGNOSIS — K219 Gastro-esophageal reflux disease without esophagitis: Secondary | ICD-10-CM | POA: Diagnosis not present

## 2021-03-10 MED ORDER — OMEPRAZOLE 40 MG PO CPDR: 40.0000 mg | DELAYED_RELEASE_CAPSULE | Freq: Every day | ORAL | 3 refills | Status: DC

## 2021-03-10 NOTE — Patient Instructions (Signed)
Preventive Care 74 Years and Older, Female Preventive care refers to lifestyle choices and visits with your health care provider that can promote health and wellness. This includes: A yearly physical exam. This is also called an annual wellness visit. Regular dental and eye exams. Immunizations. Screening for certain conditions. Healthy lifestyle choices, such as: Eating a healthy diet. Getting regular exercise. Not using drugs or products that contain nicotine and tobacco. Limiting alcohol use. What can I expect for my preventive care visit? Physical exam Your health care provider will check your: Height and weight. These may be used to calculate your BMI (body mass index). BMI is a measurement that tells if you are at a healthy weight. Heart rate and blood pressure. Body temperature. Skin for abnormal spots. Counseling Your health care provider may ask you questions about your: Past medical problems. Family's medical history. Alcohol, tobacco, and drug use. Emotional well-being. Home life and relationship well-being. Sexual activity. Diet, exercise, and sleep habits. History of falls. Memory and ability to understand (cognition). Work and work Statistician. Pregnancy and menstrual history. Access to firearms. What immunizations do I need? Vaccines are usually given at various ages, according to a schedule. Your health care provider will recommend vaccines for you based on your age, medical history, and lifestyle or other factors, such as travel or where you work. What tests do I need? Blood tests Lipid and cholesterol levels. These may be checked every 5 years, or more often depending on your overall health. Hepatitis C test. Hepatitis B test. Screening Lung cancer screening. You may have this screening every year starting at age 74 if you have a 30-pack-year history of smoking and currently smoke or have quit within the past 15 years. Colorectal cancer screening. All  adults should have this screening starting at age 74 and continuing until age 9. Your health care provider may recommend screening at age 74 if you are at increased risk. You will have tests every 1-10 years, depending on your results and the type of screening test. Diabetes screening. This is done by checking your blood sugar (glucose) after you have not eaten for a while (fasting). You may have this done every 1-3 years. Mammogram. This may be done every 1-2 years. Talk with your health care provider about how often you should have regular mammograms. Abdominal aortic aneurysm (AAA) screening. You may need this if you are a current or former smoker. BRCA-related cancer screening. This may be done if you have a family history of breast, ovarian, tubal, or peritoneal cancers. Other tests STD (sexually transmitted disease) testing, if you are at risk. Bone density scan. This is done to screen for osteoporosis. You may have this done starting at age 74. Talk with your health care provider about your test results, treatment options, and if necessary, the need for more tests. Follow these instructions at home: Eating and drinking  Eat a diet that includes fresh fruits and vegetables, whole grains, lean protein, and low-fat dairy products. Limit your intake of foods with high amounts of sugar, saturated fats, and salt. Take vitamin and mineral supplements as recommended by your health care provider. Do not drink alcohol if your health care provider tells you not to drink. If you drink alcohol: Limit how much you have to 0-1 drink a day. Be aware of how much alcohol is in your drink. In the [place] one drink equals one 12 oz bottle of beer (355 mL), one 5 oz glass of wine (148 mL), or one  1 oz glass of hard liquor (44 mL). Lifestyle Take daily care of your teeth and gums. Brush your teeth every morning and night with fluoride toothpaste. Floss one time each day. Stay active. Exercise for at least  30 minutes 5 or more days each week. Do not use any products that contain nicotine or tobacco, such as cigarettes, e-cigarettes, and chewing tobacco. If you need help quitting, ask your health care provider. Do not use drugs. If you are sexually active, practice safe sex. Use a condom or other form of protection in order to prevent STIs (sexually transmitted infections). Talk with your health care provider about taking a low-dose aspirin or statin. Find healthy ways to cope with stress, such as: Meditation, yoga, or listening to music. Journaling. Talking to a trusted person. Spending time with friends and family. Safety Always wear your seat belt while driving or riding in a vehicle. Do not drive: If you have been drinking alcohol. Do not ride with someone who has been drinking. When you are tired or distracted. While texting. Wear a helmet and other protective equipment during sports activities. If you have firearms in your house, make sure you follow all gun safety procedures. What's next? Visit your health care provider once a year for an annual wellness visit. Ask your health care provider how often you should have your eyes and teeth checked. Stay up to date on all vaccines. This information is not intended to replace advice given to you by your health care provider. Make sure you discuss any questions you have with your health care provider. Document Revised: 07/03/2020 Document Reviewed: 04/19/2018 Elsevier Patient Education  2022 [employer].

## 2021-03-10 NOTE — Progress Notes (Signed)
Annual Wellness Visit     Patient: Megan Cervantes, Female    DOB: 1945/06/23, 75 y.o.   MRN: [id number] Visit Date: 03/10/2021  Today's Provider: Mikey Kirschner, PA-C   Chief Complaint  Patient presents with   Annual Exam   Subjective    Megan Cervantes is a 75 y.o. female who presents today for her Annual Wellness Visit. She reports consuming a general diet. The patient does not participate in regular exercise at present. She generally feels well. She reports sleeping fairly well. She does not have additional problems to discuss today.   HPI -Patient would like to establish care with new PCP. Reports already receiving her mammogram (02/01/21) her next is not due for 2 years. Hysterectomy in 1986. Reports normal blood pressures everywhere but here, her BP was 138/72 on 03/01/21 at [place].  Medications: Outpatient Medications Prior to Visit  Medication Sig   amoxicillin-clavulanate (AUGMENTIN) 875-125 MG tablet Take 1 tablet by mouth every 12 (twelve) hours.   Cholecalciferol (VITAMIN D) 2000 units CAPS Take 1 capsule by mouth daily.   CRANBERRY CONCENTRATE PO Take by mouth daily.    CRANBERRY EXTRACT PO Take by mouth.   folic acid (FOLVITE) 1 MG tablet Take by mouth.   ibuprofen (ADVIL) 800 MG tablet Take 1 tablet (800 mg total) by mouth every 8 (eight) hours as needed.   meloxicam (MOBIC) 7.5 MG tablet Take 7.5 mg by mouth daily.    methotrexate (RHEUMATREX) 2.5 MG tablet Take by mouth.   Misc Natural Products (GLUCOSAMINE CHOND COMPLEX/MSM PO) Take 1 tablet by mouth daily.   Multiple Vitamins-Minerals (CENTRUM SILVER PO) Take 1 tablet by mouth daily.   pseudoephedrine-acetaminophen (TYLENOL SINUS) 30-500 MG TABS tablet Take 1 tablet by mouth every 4 (four) hours as needed.   vitamin B-12 (CYANOCOBALAMIN) 1000 MCG tablet Take 1,000 mcg by mouth daily.   vitamin C (ASCORBIC ACID) 250 MG tablet Take 250 mg by mouth daily.   [DISCONTINUED] omeprazole (PRILOSEC)  40 MG capsule TAKE 1 CAPSULE(40 MG) BY MOUTH DAILY   [DISCONTINUED] BIOTIN PO Take by mouth daily.  (Patient not taking: Reported on 03/10/2021)   [DISCONTINUED] magnesium oxide (MAG-OX) 400 MG tablet Take 1 tablet (400 mg total) by mouth daily. (Patient not taking: Reported on 03/10/2021)   [DISCONTINUED] polyethylene glycol powder (GLYCOLAX/MIRALAX) powder Take 17 g by mouth 2 (two) times daily as needed. (Patient not taking: Reported on 03/10/2021)   [DISCONTINUED] potassium chloride (K-DUR) 10 MEQ tablet Take 1 tablet (10 mEq total) by mouth at bedtime. (Patient not taking: Reported on 03/10/2021)   [DISCONTINUED] TURMERIC PO Take by mouth daily.  (Patient not taking: Reported on 03/10/2021)   No facility-administered medications prior to visit.    Allergies  Allergen Reactions   Azithromycin Diarrhea and Nausea Only   Bactrim [Sulfamethoxazole-Trimethoprim] Nausea Only   Cephalexin Other (See Comments)    Muscle cramps   Ciprofloxacin Other (See Comments)    Leg cramps   Macrobid [Nitrofurantoin] Other (See Comments)    Muscle cramps   Penicillin G Rash    Patient Care Team: Mikey Kirschner, PA-C as PCP - General (Physician Assistant) Estill Cotta, MD as Consulting Physician (Ophthalmology) Marry Guan, Laurice Record, MD (Orthopedic Surgery)  Review of Systems  Constitutional: Negative.   HENT:  Positive for sinus pressure.   Eyes: Negative.   Cardiovascular: Negative.   Gastrointestinal: Negative.   Endocrine: Negative.   Genitourinary: Negative.   Musculoskeletal:  Positive for arthralgias, back pain, joint  swelling and neck pain.  Skin: Negative.   Allergic/Immunologic: Negative.   Neurological: Negative.   Hematological: Negative.   Psychiatric/Behavioral: Negative.    All other systems reviewed and are negative.  Last CBC Lab Results  Component Value Date   WBC 7.2 07/09/2020   HGB 13.5 07/09/2020   HCT 40.1 07/09/2020   MCV 96 07/09/2020   MCH 32.2 07/09/2020   RDW  12.3 07/09/2020   PLT 242 78/29/5621   Last metabolic panel Lab Results  Component Value Date   GLUCOSE 116 (H) 07/09/2020   NA 140 07/09/2020   K 5.3 (H) 07/09/2020   CL 103 07/09/2020   CO2 21 07/09/2020   BUN 22 07/09/2020   CREATININE 0.68 07/09/2020   EGFR 92 07/09/2020   CALCIUM 10.3 07/09/2020   PROT 7.4 07/09/2020   ALBUMIN 4.6 07/09/2020   LABGLOB 2.8 07/09/2020   AGRATIO 1.6 07/09/2020   BILITOT 0.3 07/09/2020   ALKPHOS 122 (H) 07/09/2020   AST 29 07/09/2020   ALT 33 (H) 07/09/2020   Last lipids Lab Results  Component Value Date   CHOL 193 07/09/2020   HDL 52 07/09/2020   LDLCALC 111 (H) 07/09/2020   TRIG 174 (H) 07/09/2020   CHOLHDL 3.7 11/06/2018   Last hemoglobin A1c Lab Results  Component Value Date   HGBA1C 6.4 (H) 07/09/2020   Last thyroid functions Lab Results  Component Value Date   TSH 1.630 07/09/2020   Last vitamin D No results found for: 25OHVITD2, 25OHVITD3, VD25OH Last vitamin B12 and Folate No results found for: VITAMINB12, FOLATE      Objective    Vitals: BP (!) 153/85 (BP Location: Left Arm, Patient Position: Sitting, Cuff Size: Large)   Pulse 88   Temp 98.1 F (36.7 C) (Oral)   Ht _0  (1.676 m)   Wt 151 lb 12.8 oz (68.9 kg)   SpO2 98%   BMI 24.50 kg/m  BP Readings from Last 3 Encounters:  03/10/21 (!) 153/85  12/09/20 (S) (!) 161/100  07/07/20 132/80   Wt Readings from Last 3 Encounters:  03/10/21 151 lb 12.8 oz (68.9 kg)  12/09/20 147 lb (66.7 kg)  07/07/20 151 lb 9.6 oz (68.8 kg)      Physical Exam Constitutional:      General: She is awake.     Appearance: She is well-developed.  HENT:     Head: Normocephalic.     Right Ear: Tympanic membrane normal.     Left Ear: Tympanic membrane normal.  Eyes:     Conjunctiva/sclera: Conjunctivae normal.     Pupils: Pupils are equal, round, and reactive to light.  Neck:     Thyroid: No thyroid mass or thyromegaly.  Cardiovascular:     Rate and Rhythm: Normal rate  and regular rhythm.     Heart sounds: Normal heart sounds.  Pulmonary:     Effort: Pulmonary effort is normal.     Breath sounds: Normal breath sounds.  Abdominal:     Palpations: Abdomen is soft.     Tenderness: There is no abdominal tenderness.  Musculoskeletal:     Right lower leg: No swelling.     Left lower leg: No swelling.  Lymphadenopathy:     Cervical: No cervical adenopathy.  Skin:    General: Skin is warm.  Neurological:     Mental Status: She is alert and oriented to person, place, and time.  Psychiatric:        Attention and Perception: Attention normal.  Mood and Affect: Mood normal.        Speech: Speech normal.        Behavior: Behavior is cooperative.    Most recent functional status assessment: In your present state of health, do you have any difficulty performing the following activities: 03/10/2021  Hearing? N  Vision? N  Difficulty concentrating or making decisions? N  Walking or climbing stairs? N  Dressing or bathing? N  Doing errands, shopping? N  Some recent data might be hidden   Most recent fall risk assessment: Fall Risk  03/10/2021  Falls in the past year? -  Comment -  Number falls in past yr: 0  Injury with Fall? 0  Risk for fall due to : No Fall Risks    Most recent depression screenings: PHQ 2/9 Scores 03/10/2021 11/12/2019  PHQ - 2 Score 0 0  PHQ- 9 Score 0 -   Most recent cognitive screening: 6CIT Screen 11/12/2019  What Year? 0 points  What month? 0 points  What time? 0 points  Count back from 20 0 points  Months in reverse 0 points  Repeat phrase 0 points  Total Score 0   Most recent Audit-C alcohol use screening Alcohol Use Disorder Test (AUDIT) 03/10/2021  1. How often do you have a drink containing alcohol? 0  2. How many drinks containing alcohol do you have on a typical day when you are drinking? 0  3. How often do you have six or more drinks on one occasion? 0  AUDIT-C Score 0  Alcohol Brief  Interventions/Follow-up -   A score of 3 or more in women, and 4 or more in men indicates increased risk for alcohol abuse, EXCEPT if all of the points are from question 1   No results found for any visits on 03/10/21.  Assessment & Plan     Annual wellness visit done today including the all of the following: Reviewed patient's Family Medical History Reviewed and updated list of patient's medical providers Assessment of cognitive impairment was done Assessed patient's functional ability Established a written schedule for health screening [place] Completed and Reviewed  Exercise Activities and Dietary recommendations  Goals      DIET - INCREASE WATER INTAKE     Recommend increasing water intake to 3-4 glasses a day.      Exercise 3x per week (30 min per time)     Recommend to exercise for 3 days a week for at least 30 minutes at a time.         Immunization History  Administered Date(s) Administered   Hepatitis A 02/14/2013   Influenza, High Dose Seasonal PF 03/19/2018   Influenza-Unspecified 02/15/2017   Pneumococcal Conjugate-13 11/05/2013   Pneumococcal Polysaccharide-23 05/07/2012   Td 05/07/2012   Tdap 05/22/2015   Zoster, Live 05/22/2015    Health Maintenance  Topic Date Due   COVID-19 Vaccine (1) Never done   DEXA SCAN  Never done   Zoster Vaccines- Shingrix (1 of 2) 06/10/2021 (Originally 03/09/1965)   INFLUENZA VACCINE  08/06/2021 (Originally 12/07/2020)   MAMMOGRAM  02/01/2022   COLONOSCOPY (Pts 45-26yr Insurance coverage will need to be confirmed)  07/23/2024   TETANUS/TDAP  05/21/2025   Pneumonia Vaccine 75 Years old  Completed   Hepatitis C Screening  Completed   HPV VACCINES  Aged Out     Discussed health benefits of physical activity, and encouraged her to engage in regular exercise appropriate for her age and condition.  Problem List Items Addressed This Visit       Digestive   GERD (gastroesophageal reflux disease)    Relevant Medications   omeprazole (PRILOSEC) 40 MG capsule   Other Relevant Orders   CBC with Differential/Platelet     Other   Hypercholesterolemia   Relevant Orders   Lipid Panel With LDL/HDL Ratio   Comprehensive metabolic panel   Elevated glucose   Relevant Orders   HgB A1c   Other Visit Diagnoses     Encounter for annual wellness exam in Medicare patient    -  Primary   Relevant Orders   CBC with Differential/Platelet   Encounter for annual physical exam       Relevant Orders   CBC with Differential/Platelet   Osteoporosis screening            Return in about 1 year (around 03/10/2022) for CPE.     I, Mikey Kirschner, PA-C have reviewed all documentation for this visit. The documentation on  03/10/2021 for the exam, diagnosis, procedures, and orders are all accurate and complete.    Mikey Kirschner, PA-C  [facility] [phone] (phone) [phone] (fax)  [place]

## 2021-03-11 LAB — CBC WITH DIFFERENTIAL/PLATELET
Basophils Absolute: 0 10*3/uL (ref 0.0–0.2)
Basos: 1 %
EOS (ABSOLUTE): 0.1 10*3/uL (ref 0.0–0.4)
Eos: 2 %
Hematocrit: 40.3 % (ref 34.0–46.6)
Hemoglobin: 13.5 g/dL (ref 11.1–15.9)
Immature Grans (Abs): 0 10*3/uL (ref 0.0–0.1)
Immature Granulocytes: 0 %
Lymphocytes Absolute: 1.4 10*3/uL (ref 0.7–3.1)
Lymphs: 23 %
MCH: 33.6 pg — ABNORMAL HIGH (ref 26.6–33.0)
MCHC: 33.5 g/dL (ref 31.5–35.7)
MCV: 100 fL — ABNORMAL HIGH (ref 79–97)
Monocytes Absolute: 0.6 10*3/uL (ref 0.1–0.9)
Monocytes: 10 %
Neutrophils Absolute: 3.9 10*3/uL (ref 1.4–7.0)
Neutrophils: 64 %
Platelets: 225 10*3/uL (ref 150–450)
RBC: 4.02 x10E6/uL (ref 3.77–5.28)
RDW: 13.4 % (ref 11.7–15.4)
WBC: 6.1 10*3/uL (ref 3.4–10.8)

## 2021-03-11 LAB — COMPREHENSIVE METABOLIC PANEL
ALT: 23 IU/L (ref 0–32)
AST: 22 IU/L (ref 0–40)
Albumin/Globulin Ratio: 1.8 (ref 1.2–2.2)
Albumin: 4.4 g/dL (ref 3.7–4.7)
Alkaline Phosphatase: 113 IU/L (ref 44–121)
BUN/Creatinine Ratio: 30 — ABNORMAL HIGH (ref 12–28)
BUN: 20 mg/dL (ref 8–27)
Bilirubin Total: <0.2 mg/dL (ref 0.0–1.2)
CO2: 25 mmol/L (ref 20–29)
Calcium: 10 mg/dL (ref 8.7–10.3)
Chloride: 102 mmol/L (ref 96–106)
Creatinine, Ser: 0.66 mg/dL (ref 0.57–1.00)
Globulin, Total: 2.4 g/dL (ref 1.5–4.5)
Glucose: 105 mg/dL — ABNORMAL HIGH (ref 70–99)
Potassium: 4.9 mmol/L (ref 3.5–5.2)
Sodium: 140 mmol/L (ref 134–144)
Total Protein: 6.8 g/dL (ref 6.0–8.5)
eGFR: 92 mL/min/{1.73_m2} (ref >59)

## 2021-03-11 LAB — HEMOGLOBIN A1C
Est. average glucose Bld gHb Est-mCnc: 134 mg/dL
Hgb A1c MFr Bld: 6.3 % — ABNORMAL HIGH (ref 4.8–5.6)

## 2021-03-11 LAB — LIPID PANEL WITH LDL/HDL RATIO
Cholesterol, Total: 188 mg/dL (ref 100–199)
HDL: 54 mg/dL (ref >39)
LDL Chol Calc (NIH): 92 mg/dL (ref 0–99)
LDL/HDL Ratio: 1.7 ratio (ref 0.0–3.2)
Triglycerides: 249 mg/dL — ABNORMAL HIGH (ref 0–149)
VLDL Cholesterol Cal: 42 mg/dL — ABNORMAL HIGH (ref 5–40)

## 2021-03-14 DIAGNOSIS — R399 Unspecified symptoms and signs involving the genitourinary system: Secondary | ICD-10-CM | POA: Diagnosis not present

## 2021-03-14 DIAGNOSIS — N390 Urinary tract infection, site not specified: Secondary | ICD-10-CM | POA: Diagnosis not present

## 2021-03-14 DIAGNOSIS — R300 Dysuria: Secondary | ICD-10-CM | POA: Diagnosis not present

## 2021-03-17 ENCOUNTER — Encounter: Payer: Medicare Other | Admitting: Adult Health

## 2021-03-24 DIAGNOSIS — D0439 Carcinoma in situ of skin of other parts of face: Secondary | ICD-10-CM | POA: Diagnosis not present

## 2021-03-25 ENCOUNTER — Telehealth: Payer: Self-pay

## 2021-03-25 ENCOUNTER — Ambulatory Visit: Payer: Self-pay | Admitting: *Deleted

## 2021-03-25 ENCOUNTER — Other Ambulatory Visit: Payer: Self-pay | Admitting: Physician Assistant

## 2021-03-25 DIAGNOSIS — N3000 Acute cystitis without hematuria: Secondary | ICD-10-CM

## 2021-03-25 MED ORDER — SULFAMETHOXAZOLE-TRIMETHOPRIM 800-160 MG PO TABS: 1.0000 | ORAL_TABLET | Freq: Two times a day (BID) | ORAL | 0 refills | Status: AC

## 2021-03-25 NOTE — Telephone Encounter (Signed)
Reason for Disposition  Age > 32 years    Gets frequent UTIs   Seen at UC last week, treated with antibiotic but symptoms are back this morning.  Answer Assessment - Initial Assessment Questions 1. SEVERITY: "How bad is the pain?"  (e.g., Scale 1-10; mild, moderate, or severe)   - MILD (1-3): complains slightly about urination hurting   - MODERATE (4-7): interferes with normal activities     - SEVERE (8-10): excruciating, unwilling or unable to urinate because of the pain      Burning with urination.   Urgency too.  Same symptoms she was seen for.   I don't think the infection went away.   It was only 5 days worth of pills.   Seen at urgent care last week and given an antibiotic.   Can't take Cipro I know.   I don't remember the name of the medication they gave me at the urgent care.  I'm not at home where the bottle is. She is looking in her notes to see if she has it written down. 2. FREQUENCY: "How many times have you had painful urination today?"      Burning restarted this morning.   I get frequent UTIs.   It's from where my bowels were messed up.  I get this every time my bowels get messed up. 3. PATTERN: "Is pain present every time you urinate or just sometimes?"      Yes 4. ONSET: "When did the painful urination start?"      This morning the burning started again.   "I don't think the UTI ever went away from the 5 days worth of antibiotics. 5. FEVER: "Do you have a fever?" If Yes, ask: "What is your temperature, how was it measured, and when did it start?"     Not asked 6. PAST UTI: "Have you had a urine infection before?" If Yes, ask: "When was the last time?" and "What happened that time?"      Yes many many times.   This always happens after my bowels get messed up.  It's the bacteria you get from your bowels that gives me UTIs. 7. CAUSE: "What do you think is causing the painful urination?"  (e.g., UTI, scratch, Herpes sore)     UTI.   I was seen for this at the urgent care last  week and given antibiotic but the symptoms have returned this morning. 8. OTHER SYMPTOMS: "Do you have any other symptoms?" (e.g., flank pain, vaginal discharge, genital sores, urgency, blood in urine)     Denies blood in urine or other symptoms.   "Just my usual UTI symptoms" burning, urgency, frequency. 9. PREGNANCY: "Is there any chance you are pregnant?" "When was your last menstrual period?"     N/A due to age  Protocols used: Urination Pain - Female-A-AH

## 2021-03-25 NOTE — Progress Notes (Signed)
Review note from UTI visit over the weekend, pt was put on augmentin, culture came back resistant  Rx bactrim pt has history of nausea, advised yogurt/probiotics/take with food

## 2021-03-25 NOTE — Telephone Encounter (Signed)
I returned pt's call.   She had called in earlier c/o having a UTI and wanting another round of antibiotic called in.   She was seen at the urgent care last week and given a 5 day course of an antibiotic.   She was better but this morning the urgency, frequency and burning have resumed.   She does not want an appt she is requesting the doctor call in another round of the same antibiotic she was given at the urgent care.   She's not at home and can't recall the name of it.   She's going to call her husband at home and have him get the name from the bottle and she will call us back.  She has seen Mikey Kirschner, PA-C in the past and Dr. Brita Romp per pt.       She is going to call us back at the main number.   I let her know to ask for a nurse.   She was agreeable to this plan.

## 2021-03-25 NOTE — Telephone Encounter (Signed)
Reviewed visit and called pt-- culture returned insensitive to augmentin, put on bactrim

## 2021-03-25 NOTE — Telephone Encounter (Signed)
Copied from [place] [id number]. Topic: Quick Communication - Rx Refill/Question >> Mar 25, 2021  1:39 PM Erick Blinks wrote: Reason for CRM: amox-clav is the name of antibiotic that she said she was going to call the office -see triage note- please advise

## 2021-03-30 DIAGNOSIS — M5412 Radiculopathy, cervical region: Secondary | ICD-10-CM | POA: Diagnosis not present

## 2021-03-30 DIAGNOSIS — M9901 Segmental and somatic dysfunction of cervical region: Secondary | ICD-10-CM | POA: Diagnosis not present

## 2021-03-30 DIAGNOSIS — M5033 Other cervical disc degeneration, cervicothoracic region: Secondary | ICD-10-CM | POA: Diagnosis not present

## 2021-03-30 DIAGNOSIS — M62830 Muscle spasm of back: Secondary | ICD-10-CM | POA: Diagnosis not present

## 2021-03-31 DIAGNOSIS — M5412 Radiculopathy, cervical region: Secondary | ICD-10-CM | POA: Diagnosis not present

## 2021-03-31 DIAGNOSIS — M62830 Muscle spasm of back: Secondary | ICD-10-CM | POA: Diagnosis not present

## 2021-03-31 DIAGNOSIS — M9901 Segmental and somatic dysfunction of cervical region: Secondary | ICD-10-CM | POA: Diagnosis not present

## 2021-03-31 DIAGNOSIS — M5033 Other cervical disc degeneration, cervicothoracic region: Secondary | ICD-10-CM | POA: Diagnosis not present

## 2021-04-05 ENCOUNTER — Ambulatory Visit: Payer: Self-pay | Admitting: *Deleted

## 2021-04-05 NOTE — Telephone Encounter (Signed)
Noted  

## 2021-04-05 NOTE — Telephone Encounter (Signed)
FYI

## 2021-04-05 NOTE — Telephone Encounter (Signed)
Pt. reports fever and cough, onset Wednesday. Fever max 100. "Something"    but woke with chills and sweating yesterday AM. Reports "Weak as water." Cough is dry, reports sinus drainage and sore throat.  States cough keeping her awake at night , "Can't get anything up." Also reports 2 "Pasty" stools this AM. States again "So weak."   Has not covid tested, no home tests available. Husband also with cough and sore throat.    Pt states she will go to UC.     Reason for Disposition  Fever present > 3 days (72 hours)  Answer Assessment - Initial Assessment Questions 1. ONSET: "When did the cough begin?"      Wednesday 2. SEVERITY: "How bad is the cough today?"      Spells, cough worse at HS. OTC cough meds helped 3. SPUTUM: "Describe the color of your sputum" (none, dry cough; clear, white, yellow, green)     Dry 4. HEMOPTYSIS: "Are you coughing up any blood?" If so ask: "How much?" (flecks, streaks, tablespoons, etc.)     No 5. DIFFICULTY BREATHING: "Are you having difficulty breathing?" If Yes, ask: "How bad is it?" (e.g., mild, moderate, severe)    - MILD: No SOB at rest, mild SOB with walking, speaks normally in sentences, can lie down, no retractions, pulse < 100.    - MODERATE: SOB at rest, SOB with minimal exertion and prefers to sit, cannot lie down flat, speaks in phrases, mild retractions, audible wheezing, pulse 100-120.    - SEVERE: Very SOB at rest, speaks in single words, struggling to breathe, sitting hunched forward, retractions, pulse > 120      Weakness center of chest 6. FEVER: "Do you have a fever?" If Yes, ask: "What is your temperature, how was it measured, and when did it start?"     100.0 max Wednesday. Sunday AM sweating, chills 7. CARDIAC HISTORY: "Do you have any history of heart disease?" (e.g., heart attack, congestive heart failure)       8. LUNG HISTORY: "Do you have any history of lung disease?"  (e.g., pulmonary embolus, asthma, emphysema)      9. PE RISK  FACTORS: "Do you have a history of blood clots?" (or: recent major surgery, recent prolonged travel, bedridden)      10 . OTHER SYMPTOMS: "Do you have any other symptoms?" (e.g., runny nose, wheezing, chest pain)       Fatigued, runny nose, "Pasty BM this AM x 2."  Protocols used: Cough - Acute Non-Productive-A-AH

## 2021-04-14 DIAGNOSIS — M5412 Radiculopathy, cervical region: Secondary | ICD-10-CM | POA: Diagnosis not present

## 2021-04-14 DIAGNOSIS — M5033 Other cervical disc degeneration, cervicothoracic region: Secondary | ICD-10-CM | POA: Diagnosis not present

## 2021-04-14 DIAGNOSIS — M62830 Muscle spasm of back: Secondary | ICD-10-CM | POA: Diagnosis not present

## 2021-04-14 DIAGNOSIS — M9901 Segmental and somatic dysfunction of cervical region: Secondary | ICD-10-CM | POA: Diagnosis not present

## 2021-04-15 DIAGNOSIS — M9901 Segmental and somatic dysfunction of cervical region: Secondary | ICD-10-CM | POA: Diagnosis not present

## 2021-04-15 DIAGNOSIS — M5033 Other cervical disc degeneration, cervicothoracic region: Secondary | ICD-10-CM | POA: Diagnosis not present

## 2021-04-15 DIAGNOSIS — M62830 Muscle spasm of back: Secondary | ICD-10-CM | POA: Diagnosis not present

## 2021-04-15 DIAGNOSIS — M5412 Radiculopathy, cervical region: Secondary | ICD-10-CM | POA: Diagnosis not present

## 2021-04-16 DIAGNOSIS — M5033 Other cervical disc degeneration, cervicothoracic region: Secondary | ICD-10-CM | POA: Diagnosis not present

## 2021-04-16 DIAGNOSIS — M9901 Segmental and somatic dysfunction of cervical region: Secondary | ICD-10-CM | POA: Diagnosis not present

## 2021-04-16 DIAGNOSIS — M5412 Radiculopathy, cervical region: Secondary | ICD-10-CM | POA: Diagnosis not present

## 2021-04-16 DIAGNOSIS — M62830 Muscle spasm of back: Secondary | ICD-10-CM | POA: Diagnosis not present

## 2021-04-19 DIAGNOSIS — M62830 Muscle spasm of back: Secondary | ICD-10-CM | POA: Diagnosis not present

## 2021-04-19 DIAGNOSIS — M5033 Other cervical disc degeneration, cervicothoracic region: Secondary | ICD-10-CM | POA: Diagnosis not present

## 2021-04-19 DIAGNOSIS — M5412 Radiculopathy, cervical region: Secondary | ICD-10-CM | POA: Diagnosis not present

## 2021-04-19 DIAGNOSIS — M9901 Segmental and somatic dysfunction of cervical region: Secondary | ICD-10-CM | POA: Diagnosis not present

## 2021-04-21 DIAGNOSIS — M5412 Radiculopathy, cervical region: Secondary | ICD-10-CM | POA: Diagnosis not present

## 2021-04-21 DIAGNOSIS — M5033 Other cervical disc degeneration, cervicothoracic region: Secondary | ICD-10-CM | POA: Diagnosis not present

## 2021-04-21 DIAGNOSIS — M9901 Segmental and somatic dysfunction of cervical region: Secondary | ICD-10-CM | POA: Diagnosis not present

## 2021-04-21 DIAGNOSIS — M62830 Muscle spasm of back: Secondary | ICD-10-CM | POA: Diagnosis not present

## 2021-04-22 DIAGNOSIS — M5412 Radiculopathy, cervical region: Secondary | ICD-10-CM | POA: Diagnosis not present

## 2021-04-22 DIAGNOSIS — M62830 Muscle spasm of back: Secondary | ICD-10-CM | POA: Diagnosis not present

## 2021-04-22 DIAGNOSIS — M9901 Segmental and somatic dysfunction of cervical region: Secondary | ICD-10-CM | POA: Diagnosis not present

## 2021-04-22 DIAGNOSIS — M5033 Other cervical disc degeneration, cervicothoracic region: Secondary | ICD-10-CM | POA: Diagnosis not present

## 2021-04-26 DIAGNOSIS — M5033 Other cervical disc degeneration, cervicothoracic region: Secondary | ICD-10-CM | POA: Diagnosis not present

## 2021-04-26 DIAGNOSIS — M9901 Segmental and somatic dysfunction of cervical region: Secondary | ICD-10-CM | POA: Diagnosis not present

## 2021-04-26 DIAGNOSIS — M62830 Muscle spasm of back: Secondary | ICD-10-CM | POA: Diagnosis not present

## 2021-04-26 DIAGNOSIS — M5412 Radiculopathy, cervical region: Secondary | ICD-10-CM | POA: Diagnosis not present

## 2021-04-28 DIAGNOSIS — M5412 Radiculopathy, cervical region: Secondary | ICD-10-CM | POA: Diagnosis not present

## 2021-04-28 DIAGNOSIS — M62830 Muscle spasm of back: Secondary | ICD-10-CM | POA: Diagnosis not present

## 2021-04-28 DIAGNOSIS — M5033 Other cervical disc degeneration, cervicothoracic region: Secondary | ICD-10-CM | POA: Diagnosis not present

## 2021-04-28 DIAGNOSIS — M9901 Segmental and somatic dysfunction of cervical region: Secondary | ICD-10-CM | POA: Diagnosis not present

## 2021-04-29 DIAGNOSIS — M9901 Segmental and somatic dysfunction of cervical region: Secondary | ICD-10-CM | POA: Diagnosis not present

## 2021-04-29 DIAGNOSIS — M5412 Radiculopathy, cervical region: Secondary | ICD-10-CM | POA: Diagnosis not present

## 2021-04-29 DIAGNOSIS — M5033 Other cervical disc degeneration, cervicothoracic region: Secondary | ICD-10-CM | POA: Diagnosis not present

## 2021-04-29 DIAGNOSIS — M62830 Muscle spasm of back: Secondary | ICD-10-CM | POA: Diagnosis not present

## 2021-05-04 DIAGNOSIS — M62830 Muscle spasm of back: Secondary | ICD-10-CM | POA: Diagnosis not present

## 2021-05-04 DIAGNOSIS — M5412 Radiculopathy, cervical region: Secondary | ICD-10-CM | POA: Diagnosis not present

## 2021-05-04 DIAGNOSIS — M5033 Other cervical disc degeneration, cervicothoracic region: Secondary | ICD-10-CM | POA: Diagnosis not present

## 2021-05-04 DIAGNOSIS — M9901 Segmental and somatic dysfunction of cervical region: Secondary | ICD-10-CM | POA: Diagnosis not present

## 2021-05-05 DIAGNOSIS — M9901 Segmental and somatic dysfunction of cervical region: Secondary | ICD-10-CM | POA: Diagnosis not present

## 2021-05-05 DIAGNOSIS — M5412 Radiculopathy, cervical region: Secondary | ICD-10-CM | POA: Diagnosis not present

## 2021-05-05 DIAGNOSIS — M5033 Other cervical disc degeneration, cervicothoracic region: Secondary | ICD-10-CM | POA: Diagnosis not present

## 2021-05-05 DIAGNOSIS — M62830 Muscle spasm of back: Secondary | ICD-10-CM | POA: Diagnosis not present

## 2021-05-07 DIAGNOSIS — M9901 Segmental and somatic dysfunction of cervical region: Secondary | ICD-10-CM | POA: Diagnosis not present

## 2021-05-07 DIAGNOSIS — M5412 Radiculopathy, cervical region: Secondary | ICD-10-CM | POA: Diagnosis not present

## 2021-05-07 DIAGNOSIS — M5033 Other cervical disc degeneration, cervicothoracic region: Secondary | ICD-10-CM | POA: Diagnosis not present

## 2021-05-07 DIAGNOSIS — M62830 Muscle spasm of back: Secondary | ICD-10-CM | POA: Diagnosis not present

## 2021-05-11 DIAGNOSIS — M9901 Segmental and somatic dysfunction of cervical region: Secondary | ICD-10-CM | POA: Diagnosis not present

## 2021-05-11 DIAGNOSIS — M5033 Other cervical disc degeneration, cervicothoracic region: Secondary | ICD-10-CM | POA: Diagnosis not present

## 2021-05-11 DIAGNOSIS — M5412 Radiculopathy, cervical region: Secondary | ICD-10-CM | POA: Diagnosis not present

## 2021-05-11 DIAGNOSIS — M62830 Muscle spasm of back: Secondary | ICD-10-CM | POA: Diagnosis not present

## 2021-05-13 DIAGNOSIS — M62830 Muscle spasm of back: Secondary | ICD-10-CM | POA: Diagnosis not present

## 2021-05-13 DIAGNOSIS — M5033 Other cervical disc degeneration, cervicothoracic region: Secondary | ICD-10-CM | POA: Diagnosis not present

## 2021-05-13 DIAGNOSIS — M5412 Radiculopathy, cervical region: Secondary | ICD-10-CM | POA: Diagnosis not present

## 2021-05-13 DIAGNOSIS — M9901 Segmental and somatic dysfunction of cervical region: Secondary | ICD-10-CM | POA: Diagnosis not present

## 2021-05-17 DIAGNOSIS — M9901 Segmental and somatic dysfunction of cervical region: Secondary | ICD-10-CM | POA: Diagnosis not present

## 2021-05-17 DIAGNOSIS — M5412 Radiculopathy, cervical region: Secondary | ICD-10-CM | POA: Diagnosis not present

## 2021-05-17 DIAGNOSIS — M5033 Other cervical disc degeneration, cervicothoracic region: Secondary | ICD-10-CM | POA: Diagnosis not present

## 2021-05-17 DIAGNOSIS — M62830 Muscle spasm of back: Secondary | ICD-10-CM | POA: Diagnosis not present

## 2021-05-20 DIAGNOSIS — M62830 Muscle spasm of back: Secondary | ICD-10-CM | POA: Diagnosis not present

## 2021-05-20 DIAGNOSIS — M5412 Radiculopathy, cervical region: Secondary | ICD-10-CM | POA: Diagnosis not present

## 2021-05-20 DIAGNOSIS — M5033 Other cervical disc degeneration, cervicothoracic region: Secondary | ICD-10-CM | POA: Diagnosis not present

## 2021-05-20 DIAGNOSIS — M9901 Segmental and somatic dysfunction of cervical region: Secondary | ICD-10-CM | POA: Diagnosis not present

## 2021-05-25 DIAGNOSIS — M62830 Muscle spasm of back: Secondary | ICD-10-CM | POA: Diagnosis not present

## 2021-05-25 DIAGNOSIS — M5412 Radiculopathy, cervical region: Secondary | ICD-10-CM | POA: Diagnosis not present

## 2021-05-25 DIAGNOSIS — M5033 Other cervical disc degeneration, cervicothoracic region: Secondary | ICD-10-CM | POA: Diagnosis not present

## 2021-05-25 DIAGNOSIS — M9901 Segmental and somatic dysfunction of cervical region: Secondary | ICD-10-CM | POA: Diagnosis not present

## 2021-05-27 DIAGNOSIS — M5412 Radiculopathy, cervical region: Secondary | ICD-10-CM | POA: Diagnosis not present

## 2021-05-27 DIAGNOSIS — M5033 Other cervical disc degeneration, cervicothoracic region: Secondary | ICD-10-CM | POA: Diagnosis not present

## 2021-05-27 DIAGNOSIS — M9901 Segmental and somatic dysfunction of cervical region: Secondary | ICD-10-CM | POA: Diagnosis not present

## 2021-05-27 DIAGNOSIS — M62830 Muscle spasm of back: Secondary | ICD-10-CM | POA: Diagnosis not present

## 2021-05-31 DIAGNOSIS — M9901 Segmental and somatic dysfunction of cervical region: Secondary | ICD-10-CM | POA: Diagnosis not present

## 2021-05-31 DIAGNOSIS — M62830 Muscle spasm of back: Secondary | ICD-10-CM | POA: Diagnosis not present

## 2021-05-31 DIAGNOSIS — M5033 Other cervical disc degeneration, cervicothoracic region: Secondary | ICD-10-CM | POA: Diagnosis not present

## 2021-05-31 DIAGNOSIS — M5412 Radiculopathy, cervical region: Secondary | ICD-10-CM | POA: Diagnosis not present

## 2021-06-03 DIAGNOSIS — M5412 Radiculopathy, cervical region: Secondary | ICD-10-CM | POA: Diagnosis not present

## 2021-06-03 DIAGNOSIS — M9901 Segmental and somatic dysfunction of cervical region: Secondary | ICD-10-CM | POA: Diagnosis not present

## 2021-06-03 DIAGNOSIS — M62830 Muscle spasm of back: Secondary | ICD-10-CM | POA: Diagnosis not present

## 2021-06-03 DIAGNOSIS — M5033 Other cervical disc degeneration, cervicothoracic region: Secondary | ICD-10-CM | POA: Diagnosis not present

## 2021-06-07 DIAGNOSIS — M5412 Radiculopathy, cervical region: Secondary | ICD-10-CM | POA: Diagnosis not present

## 2021-06-07 DIAGNOSIS — M5033 Other cervical disc degeneration, cervicothoracic region: Secondary | ICD-10-CM | POA: Diagnosis not present

## 2021-06-07 DIAGNOSIS — M9901 Segmental and somatic dysfunction of cervical region: Secondary | ICD-10-CM | POA: Diagnosis not present

## 2021-06-07 DIAGNOSIS — M62830 Muscle spasm of back: Secondary | ICD-10-CM | POA: Diagnosis not present

## 2021-06-10 DIAGNOSIS — M62830 Muscle spasm of back: Secondary | ICD-10-CM | POA: Diagnosis not present

## 2021-06-10 DIAGNOSIS — M9901 Segmental and somatic dysfunction of cervical region: Secondary | ICD-10-CM | POA: Diagnosis not present

## 2021-06-10 DIAGNOSIS — M5412 Radiculopathy, cervical region: Secondary | ICD-10-CM | POA: Diagnosis not present

## 2021-06-10 DIAGNOSIS — M5033 Other cervical disc degeneration, cervicothoracic region: Secondary | ICD-10-CM | POA: Diagnosis not present

## 2021-06-11 ENCOUNTER — Ambulatory Visit: Payer: Medicare Other | Admitting: Physician Assistant

## 2021-06-14 DIAGNOSIS — M5412 Radiculopathy, cervical region: Secondary | ICD-10-CM | POA: Diagnosis not present

## 2021-06-14 DIAGNOSIS — M9901 Segmental and somatic dysfunction of cervical region: Secondary | ICD-10-CM | POA: Diagnosis not present

## 2021-06-14 DIAGNOSIS — M62830 Muscle spasm of back: Secondary | ICD-10-CM | POA: Diagnosis not present

## 2021-06-14 DIAGNOSIS — M5033 Other cervical disc degeneration, cervicothoracic region: Secondary | ICD-10-CM | POA: Diagnosis not present

## 2021-06-17 DIAGNOSIS — M9901 Segmental and somatic dysfunction of cervical region: Secondary | ICD-10-CM | POA: Diagnosis not present

## 2021-06-17 DIAGNOSIS — M5412 Radiculopathy, cervical region: Secondary | ICD-10-CM | POA: Diagnosis not present

## 2021-06-17 DIAGNOSIS — M5033 Other cervical disc degeneration, cervicothoracic region: Secondary | ICD-10-CM | POA: Diagnosis not present

## 2021-06-17 DIAGNOSIS — M62830 Muscle spasm of back: Secondary | ICD-10-CM | POA: Diagnosis not present

## 2021-06-23 DIAGNOSIS — M059 Rheumatoid arthritis with rheumatoid factor, unspecified: Secondary | ICD-10-CM | POA: Insufficient documentation

## 2021-06-23 DIAGNOSIS — E119 Type 2 diabetes mellitus without complications: Secondary | ICD-10-CM | POA: Diagnosis not present

## 2021-06-23 DIAGNOSIS — M9901 Segmental and somatic dysfunction of cervical region: Secondary | ICD-10-CM | POA: Diagnosis not present

## 2021-06-23 DIAGNOSIS — M5412 Radiculopathy, cervical region: Secondary | ICD-10-CM | POA: Diagnosis not present

## 2021-06-23 DIAGNOSIS — M62830 Muscle spasm of back: Secondary | ICD-10-CM | POA: Diagnosis not present

## 2021-06-23 DIAGNOSIS — M5033 Other cervical disc degeneration, cervicothoracic region: Secondary | ICD-10-CM | POA: Diagnosis not present

## 2021-06-23 DIAGNOSIS — Z9049 Acquired absence of other specified parts of digestive tract: Secondary | ICD-10-CM | POA: Insufficient documentation

## 2021-06-23 DIAGNOSIS — Z0000 Encounter for general adult medical examination without abnormal findings: Secondary | ICD-10-CM | POA: Insufficient documentation

## 2021-06-23 DIAGNOSIS — E538 Deficiency of other specified B group vitamins: Secondary | ICD-10-CM | POA: Diagnosis not present

## 2021-06-30 DIAGNOSIS — M5033 Other cervical disc degeneration, cervicothoracic region: Secondary | ICD-10-CM | POA: Diagnosis not present

## 2021-06-30 DIAGNOSIS — M62830 Muscle spasm of back: Secondary | ICD-10-CM | POA: Diagnosis not present

## 2021-06-30 DIAGNOSIS — M5412 Radiculopathy, cervical region: Secondary | ICD-10-CM | POA: Diagnosis not present

## 2021-06-30 DIAGNOSIS — M9901 Segmental and somatic dysfunction of cervical region: Secondary | ICD-10-CM | POA: Diagnosis not present

## 2021-07-07 DIAGNOSIS — M5412 Radiculopathy, cervical region: Secondary | ICD-10-CM | POA: Diagnosis not present

## 2021-07-07 DIAGNOSIS — M5033 Other cervical disc degeneration, cervicothoracic region: Secondary | ICD-10-CM | POA: Diagnosis not present

## 2021-07-07 DIAGNOSIS — M62830 Muscle spasm of back: Secondary | ICD-10-CM | POA: Diagnosis not present

## 2021-07-07 DIAGNOSIS — M9901 Segmental and somatic dysfunction of cervical region: Secondary | ICD-10-CM | POA: Diagnosis not present

## 2021-07-19 DIAGNOSIS — Z961 Presence of intraocular lens: Secondary | ICD-10-CM | POA: Diagnosis not present

## 2021-07-21 DIAGNOSIS — M62830 Muscle spasm of back: Secondary | ICD-10-CM | POA: Diagnosis not present

## 2021-07-21 DIAGNOSIS — M9901 Segmental and somatic dysfunction of cervical region: Secondary | ICD-10-CM | POA: Diagnosis not present

## 2021-07-21 DIAGNOSIS — M5412 Radiculopathy, cervical region: Secondary | ICD-10-CM | POA: Diagnosis not present

## 2021-07-21 DIAGNOSIS — M5033 Other cervical disc degeneration, cervicothoracic region: Secondary | ICD-10-CM | POA: Diagnosis not present

## 2021-08-11 DIAGNOSIS — M9901 Segmental and somatic dysfunction of cervical region: Secondary | ICD-10-CM | POA: Diagnosis not present

## 2021-08-11 DIAGNOSIS — M5412 Radiculopathy, cervical region: Secondary | ICD-10-CM | POA: Diagnosis not present

## 2021-08-11 DIAGNOSIS — M62830 Muscle spasm of back: Secondary | ICD-10-CM | POA: Diagnosis not present

## 2021-08-11 DIAGNOSIS — M5033 Other cervical disc degeneration, cervicothoracic region: Secondary | ICD-10-CM | POA: Diagnosis not present

## 2021-09-01 DIAGNOSIS — M9901 Segmental and somatic dysfunction of cervical region: Secondary | ICD-10-CM | POA: Diagnosis not present

## 2021-09-01 DIAGNOSIS — M5412 Radiculopathy, cervical region: Secondary | ICD-10-CM | POA: Diagnosis not present

## 2021-09-01 DIAGNOSIS — M5033 Other cervical disc degeneration, cervicothoracic region: Secondary | ICD-10-CM | POA: Diagnosis not present

## 2021-09-01 DIAGNOSIS — M62830 Muscle spasm of back: Secondary | ICD-10-CM | POA: Diagnosis not present

## 2021-09-22 DIAGNOSIS — L821 Other seborrheic keratosis: Secondary | ICD-10-CM | POA: Diagnosis not present

## 2021-09-22 DIAGNOSIS — D2262 Melanocytic nevi of left upper limb, including shoulder: Secondary | ICD-10-CM | POA: Diagnosis not present

## 2021-09-22 DIAGNOSIS — M5412 Radiculopathy, cervical region: Secondary | ICD-10-CM | POA: Diagnosis not present

## 2021-09-22 DIAGNOSIS — M9901 Segmental and somatic dysfunction of cervical region: Secondary | ICD-10-CM | POA: Diagnosis not present

## 2021-09-22 DIAGNOSIS — Z85828 Personal history of other malignant neoplasm of skin: Secondary | ICD-10-CM | POA: Diagnosis not present

## 2021-09-22 DIAGNOSIS — L608 Other nail disorders: Secondary | ICD-10-CM | POA: Diagnosis not present

## 2021-09-22 DIAGNOSIS — D225 Melanocytic nevi of trunk: Secondary | ICD-10-CM | POA: Diagnosis not present

## 2021-09-22 DIAGNOSIS — M62830 Muscle spasm of back: Secondary | ICD-10-CM | POA: Diagnosis not present

## 2021-09-22 DIAGNOSIS — D2271 Melanocytic nevi of right lower limb, including hip: Secondary | ICD-10-CM | POA: Diagnosis not present

## 2021-09-22 DIAGNOSIS — M5033 Other cervical disc degeneration, cervicothoracic region: Secondary | ICD-10-CM | POA: Diagnosis not present

## 2021-09-22 DIAGNOSIS — D2272 Melanocytic nevi of left lower limb, including hip: Secondary | ICD-10-CM | POA: Diagnosis not present

## 2021-10-13 DIAGNOSIS — M5033 Other cervical disc degeneration, cervicothoracic region: Secondary | ICD-10-CM | POA: Diagnosis not present

## 2021-10-13 DIAGNOSIS — M5412 Radiculopathy, cervical region: Secondary | ICD-10-CM | POA: Diagnosis not present

## 2021-10-13 DIAGNOSIS — M62830 Muscle spasm of back: Secondary | ICD-10-CM | POA: Diagnosis not present

## 2021-10-13 DIAGNOSIS — M9901 Segmental and somatic dysfunction of cervical region: Secondary | ICD-10-CM | POA: Diagnosis not present

## 2021-11-02 DIAGNOSIS — R3915 Urgency of urination: Secondary | ICD-10-CM | POA: Diagnosis not present

## 2021-11-04 DIAGNOSIS — M5412 Radiculopathy, cervical region: Secondary | ICD-10-CM | POA: Diagnosis not present

## 2021-11-04 DIAGNOSIS — M9901 Segmental and somatic dysfunction of cervical region: Secondary | ICD-10-CM | POA: Diagnosis not present

## 2021-11-04 DIAGNOSIS — M5033 Other cervical disc degeneration, cervicothoracic region: Secondary | ICD-10-CM | POA: Diagnosis not present

## 2021-11-04 DIAGNOSIS — M62830 Muscle spasm of back: Secondary | ICD-10-CM | POA: Diagnosis not present

## 2021-11-24 DIAGNOSIS — N390 Urinary tract infection, site not specified: Secondary | ICD-10-CM | POA: Diagnosis not present

## 2021-11-24 DIAGNOSIS — M069 Rheumatoid arthritis, unspecified: Secondary | ICD-10-CM | POA: Diagnosis not present

## 2021-11-24 DIAGNOSIS — E119 Type 2 diabetes mellitus without complications: Secondary | ICD-10-CM | POA: Diagnosis not present

## 2021-11-25 DIAGNOSIS — M9901 Segmental and somatic dysfunction of cervical region: Secondary | ICD-10-CM | POA: Diagnosis not present

## 2021-11-25 DIAGNOSIS — M62830 Muscle spasm of back: Secondary | ICD-10-CM | POA: Diagnosis not present

## 2021-11-25 DIAGNOSIS — M5412 Radiculopathy, cervical region: Secondary | ICD-10-CM | POA: Diagnosis not present

## 2021-11-25 DIAGNOSIS — M5033 Other cervical disc degeneration, cervicothoracic region: Secondary | ICD-10-CM | POA: Diagnosis not present

## 2021-12-15 DIAGNOSIS — M5412 Radiculopathy, cervical region: Secondary | ICD-10-CM | POA: Diagnosis not present

## 2021-12-15 DIAGNOSIS — M62830 Muscle spasm of back: Secondary | ICD-10-CM | POA: Diagnosis not present

## 2021-12-15 DIAGNOSIS — M9901 Segmental and somatic dysfunction of cervical region: Secondary | ICD-10-CM | POA: Diagnosis not present

## 2021-12-15 DIAGNOSIS — M5033 Other cervical disc degeneration, cervicothoracic region: Secondary | ICD-10-CM | POA: Diagnosis not present

## 2021-12-21 DIAGNOSIS — M059 Rheumatoid arthritis with rheumatoid factor, unspecified: Secondary | ICD-10-CM | POA: Diagnosis not present

## 2021-12-29 DIAGNOSIS — M62830 Muscle spasm of back: Secondary | ICD-10-CM | POA: Diagnosis not present

## 2021-12-29 DIAGNOSIS — M5033 Other cervical disc degeneration, cervicothoracic region: Secondary | ICD-10-CM | POA: Diagnosis not present

## 2021-12-29 DIAGNOSIS — M9901 Segmental and somatic dysfunction of cervical region: Secondary | ICD-10-CM | POA: Diagnosis not present

## 2021-12-29 DIAGNOSIS — M5412 Radiculopathy, cervical region: Secondary | ICD-10-CM | POA: Diagnosis not present

## 2022-01-04 ENCOUNTER — Other Ambulatory Visit: Payer: Self-pay | Admitting: Internal Medicine

## 2022-01-04 DIAGNOSIS — Z1231 Encounter for screening mammogram for malignant neoplasm of breast: Secondary | ICD-10-CM

## 2022-01-12 DIAGNOSIS — M5033 Other cervical disc degeneration, cervicothoracic region: Secondary | ICD-10-CM | POA: Diagnosis not present

## 2022-01-12 DIAGNOSIS — M9901 Segmental and somatic dysfunction of cervical region: Secondary | ICD-10-CM | POA: Diagnosis not present

## 2022-01-12 DIAGNOSIS — M62830 Muscle spasm of back: Secondary | ICD-10-CM | POA: Diagnosis not present

## 2022-01-12 DIAGNOSIS — M5412 Radiculopathy, cervical region: Secondary | ICD-10-CM | POA: Diagnosis not present

## 2022-02-03 DIAGNOSIS — M9901 Segmental and somatic dysfunction of cervical region: Secondary | ICD-10-CM | POA: Diagnosis not present

## 2022-02-03 DIAGNOSIS — M5033 Other cervical disc degeneration, cervicothoracic region: Secondary | ICD-10-CM | POA: Diagnosis not present

## 2022-02-03 DIAGNOSIS — M62830 Muscle spasm of back: Secondary | ICD-10-CM | POA: Diagnosis not present

## 2022-02-03 DIAGNOSIS — M5412 Radiculopathy, cervical region: Secondary | ICD-10-CM | POA: Diagnosis not present

## 2022-02-14 ENCOUNTER — Ambulatory Visit
Admission: RE | Admit: 2022-02-14 | Discharge: 2022-02-14 | Disposition: A | Discharge status: Home / Self Care | Payer: Medicare Other | Source: Ambulatory Visit | Attending: Internal Medicine | Admitting: Internal Medicine

## 2022-02-14 DIAGNOSIS — Z1231 Encounter for screening mammogram for malignant neoplasm of breast: Secondary | ICD-10-CM | POA: Insufficient documentation

## 2022-02-16 ENCOUNTER — Ambulatory Visit (INDEPENDENT_AMBULATORY_CARE_PROVIDER_SITE_OTHER): Payer: Medicare Other | Admitting: Podiatry

## 2022-02-16 ENCOUNTER — Encounter: Payer: Self-pay | Admitting: Podiatry

## 2022-02-16 ENCOUNTER — Ambulatory Visit (INDEPENDENT_AMBULATORY_CARE_PROVIDER_SITE_OTHER): Payer: Medicare Other

## 2022-02-16 DIAGNOSIS — M2042 Other hammer toe(s) (acquired), left foot: Secondary | ICD-10-CM

## 2022-02-16 DIAGNOSIS — D2372 Other benign neoplasm of skin of left lower limb, including hip: Secondary | ICD-10-CM

## 2022-02-17 ENCOUNTER — Other Ambulatory Visit: Payer: Self-pay | Admitting: Internal Medicine

## 2022-02-17 DIAGNOSIS — Z85828 Personal history of other malignant neoplasm of skin: Secondary | ICD-10-CM | POA: Diagnosis not present

## 2022-02-17 DIAGNOSIS — Z09 Encounter for follow-up examination after completed treatment for conditions other than malignant neoplasm: Secondary | ICD-10-CM | POA: Diagnosis not present

## 2022-02-17 DIAGNOSIS — D2261 Melanocytic nevi of right upper limb, including shoulder: Secondary | ICD-10-CM | POA: Diagnosis not present

## 2022-02-17 DIAGNOSIS — R928 Other abnormal and inconclusive findings on diagnostic imaging of breast: Secondary | ICD-10-CM

## 2022-02-17 DIAGNOSIS — D2262 Melanocytic nevi of left upper limb, including shoulder: Secondary | ICD-10-CM | POA: Diagnosis not present

## 2022-02-17 DIAGNOSIS — D2272 Melanocytic nevi of left lower limb, including hip: Secondary | ICD-10-CM | POA: Diagnosis not present

## 2022-02-17 DIAGNOSIS — Z872 Personal history of diseases of the skin and subcutaneous tissue: Secondary | ICD-10-CM | POA: Diagnosis not present

## 2022-02-17 DIAGNOSIS — R921 Mammographic calcification found on diagnostic imaging of breast: Secondary | ICD-10-CM

## 2022-02-17 NOTE — Progress Notes (Signed)
Subjective:  Patient ID: Megan Cervantes, female    DOB: [birth year],  MRN: [id number] HPI Chief Complaint  Patient presents with   Toe Pain    1st and 2nd toes left - corns x months, rubbing each other, pedicurist trims, keeps padding between, previous HT surgery on 2nd toe, also 5th toe-lateral side of toenail is sore   New Patient (Initial Visit)    76 y.o. female presents with the above complaint.   ROS: Denies fever chills nausea vomit muscle aches pains calf pain back pain chest pain shortness of breath.  Past Medical History:  Diagnosis Date   Arthritis    Hyperlipidemia    Past Surgical History:  Procedure Laterality Date   ABDOMINAL HYSTERECTOMY  1986   total   APPENDECTOMY     BIOPSY THYROID Bilateral    BREAST BIOPSY Left 02/03/2021   Affirm bx-calcs, "RIBBON" clip-path pending   BREAST CYST ASPIRATION Left 1990's   CHOLECYSTECTOMY  90's   diverticulisitis  2016   removed about a foot of colon   EYE SURGERY Bilateral 2010   cataract   HERNIA REPAIR  11/2015   mesh covers large portion of stomach    Current Outpatient Medications:    amoxicillin-clavulanate (AUGMENTIN) 875-125 MG tablet, Take 1 tablet by mouth every 12 (twelve) hours., Disp: 14 tablet, Rfl: 0   Cholecalciferol (VITAMIN D) 2000 units CAPS, Take 1 capsule by mouth daily., Disp: , Rfl:    CRANBERRY CONCENTRATE PO, Take by mouth daily. , Disp: , Rfl:    CRANBERRY EXTRACT PO, Take by mouth., Disp: , Rfl:    ibuprofen (ADVIL) 800 MG tablet, Take 1 tablet (800 mg total) by mouth every 8 (eight) hours as needed., Disp: 30 tablet, Rfl: 5   meloxicam (MOBIC) 7.5 MG tablet, Take 7.5 mg by mouth daily. , Disp: , Rfl:    Misc Natural Products (GLUCOSAMINE CHOND COMPLEX/MSM PO), Take 1 tablet by mouth daily., Disp: , Rfl:    Multiple Vitamins-Minerals (CENTRUM SILVER PO), Take 1 tablet by mouth daily., Disp: , Rfl:    omeprazole (PRILOSEC) 40 MG capsule, Take 1 capsule (40 mg total) by mouth daily. TAKE 1  CAPSULE(40 MG) BY MOUTH DAILY, Disp: 90 capsule, Rfl: 3   pseudoephedrine-acetaminophen (TYLENOL SINUS) 30-500 MG TABS tablet, Take 1 tablet by mouth every 4 (four) hours as needed., Disp: , Rfl:    vitamin B-12 (CYANOCOBALAMIN) 1000 MCG tablet, Take 1,000 mcg by mouth daily., Disp: , Rfl:   Allergies  Allergen Reactions   Azithromycin Diarrhea and Nausea Only   Bactrim [Sulfamethoxazole-Trimethoprim] Nausea Only   Cephalexin Other (See Comments)    Muscle cramps   Ciprofloxacin Other (See Comments)    Leg cramps   Macrobid [Nitrofurantoin] Other (See Comments)    Muscle cramps   Penicillin G Rash   Review of Systems Objective:  There were no vitals filed for this visit.  General: Well developed, nourished, in no acute distress, alert and oriented x3   Dermatological: Skin is warm, dry and supple bilateral. Nails x 10 are well maintained; remaining integument appears unremarkable at this time. There are no open sores, no preulcerative lesions, no rash or signs of infection present.  Painful callus lateral hallux due to the juxtaposition of the second toe  Vascular: Dorsalis Pedis artery and Posterior Tibial artery pedal pulses are 2/4 bilateral with immedate capillary fill time. Pedal hair growth present. No varicosities and no lower extremity edema present bilateral.   Neruologic: Grossly intact via light  touch bilateral. Vibratory intact via tuning fork bilateral. Protective threshold with Semmes Wienstein monofilament intact to all pedal sites bilateral. Patellar and Achilles deep tendon reflexes 2+ bilateral. No Babinski or clonus noted bilateral.   Musculoskeletal: No gross boney pedal deformities bilateral. No pain, crepitus, or limitation noted with foot and ankle range of motion bilateral. Muscular strength 5/5 in all groups tested bilateral.  Hammertoe deformities rigid in nature with medial deviation of the second toe juxtaposition to the hallux is resulting in painful callus  lateral hallux  Gait: Unassisted, Nonantalgic.    Radiographs:  Radiographs taken today demonstrate an osseously mature individual with significant osteopenia and significant midfoot osteoarthritic changes.  Hammertoe deformities are present in with what appears to have been multiple arthroplasties to the toes.  She now has medial deviation of the second toe which is rubbing against the base of the distal phalanx laterally.  Assessment & Plan:   Assessment: Hammertoe and hallux valgus resulting in painful callus lateral aspect of the hallux.  Plan: Debridement and padding of the lesion today discussed the possible need for exostectomy.  She understands and is amenable to it we will notify me if she would like to proceed.     Zennie Ayars T. [place], [place]

## 2022-02-25 DIAGNOSIS — M5033 Other cervical disc degeneration, cervicothoracic region: Secondary | ICD-10-CM | POA: Diagnosis not present

## 2022-02-25 DIAGNOSIS — M5412 Radiculopathy, cervical region: Secondary | ICD-10-CM | POA: Diagnosis not present

## 2022-02-25 DIAGNOSIS — M9901 Segmental and somatic dysfunction of cervical region: Secondary | ICD-10-CM | POA: Diagnosis not present

## 2022-02-25 DIAGNOSIS — M62830 Muscle spasm of back: Secondary | ICD-10-CM | POA: Diagnosis not present

## 2022-03-01 ENCOUNTER — Ambulatory Visit
Admission: RE | Admit: 2022-03-01 | Discharge: 2022-03-01 | Disposition: A | Discharge status: Home / Self Care | Payer: Medicare Other | Source: Ambulatory Visit | Attending: Internal Medicine | Admitting: Internal Medicine

## 2022-03-01 DIAGNOSIS — R921 Mammographic calcification found on diagnostic imaging of breast: Secondary | ICD-10-CM | POA: Insufficient documentation

## 2022-03-01 DIAGNOSIS — R928 Other abnormal and inconclusive findings on diagnostic imaging of breast: Secondary | ICD-10-CM | POA: Diagnosis not present

## 2022-03-02 DIAGNOSIS — S060X1A Concussion with loss of consciousness of 30 minutes or less, initial encounter: Secondary | ICD-10-CM | POA: Diagnosis not present

## 2022-03-02 DIAGNOSIS — R410 Disorientation, unspecified: Secondary | ICD-10-CM | POA: Diagnosis not present

## 2022-03-02 DIAGNOSIS — Z881 Allergy status to other antibiotic agents status: Secondary | ICD-10-CM | POA: Diagnosis not present

## 2022-03-02 DIAGNOSIS — Z880 Allergy status to penicillin: Secondary | ICD-10-CM | POA: Diagnosis not present

## 2022-03-02 DIAGNOSIS — M549 Dorsalgia, unspecified: Secondary | ICD-10-CM | POA: Diagnosis not present

## 2022-03-02 DIAGNOSIS — M47812 Spondylosis without myelopathy or radiculopathy, cervical region: Secondary | ICD-10-CM | POA: Diagnosis not present

## 2022-03-02 DIAGNOSIS — Z043 Encounter for examination and observation following other accident: Secondary | ICD-10-CM | POA: Diagnosis not present

## 2022-03-02 DIAGNOSIS — S0990XA Unspecified injury of head, initial encounter: Secondary | ICD-10-CM | POA: Diagnosis not present

## 2022-03-04 DIAGNOSIS — M9901 Segmental and somatic dysfunction of cervical region: Secondary | ICD-10-CM | POA: Diagnosis not present

## 2022-03-04 DIAGNOSIS — M5412 Radiculopathy, cervical region: Secondary | ICD-10-CM | POA: Diagnosis not present

## 2022-03-04 DIAGNOSIS — M62830 Muscle spasm of back: Secondary | ICD-10-CM | POA: Diagnosis not present

## 2022-03-04 DIAGNOSIS — M5033 Other cervical disc degeneration, cervicothoracic region: Secondary | ICD-10-CM | POA: Diagnosis not present

## 2022-03-05 ENCOUNTER — Other Ambulatory Visit: Payer: Self-pay | Admitting: Physician Assistant

## 2022-03-05 DIAGNOSIS — K219 Gastro-esophageal reflux disease without esophagitis: Secondary | ICD-10-CM

## 2022-03-07 DIAGNOSIS — M62830 Muscle spasm of back: Secondary | ICD-10-CM | POA: Diagnosis not present

## 2022-03-07 DIAGNOSIS — E041 Nontoxic single thyroid nodule: Secondary | ICD-10-CM | POA: Diagnosis not present

## 2022-03-07 DIAGNOSIS — M9901 Segmental and somatic dysfunction of cervical region: Secondary | ICD-10-CM | POA: Diagnosis not present

## 2022-03-07 DIAGNOSIS — M5412 Radiculopathy, cervical region: Secondary | ICD-10-CM | POA: Diagnosis not present

## 2022-03-07 DIAGNOSIS — M5033 Other cervical disc degeneration, cervicothoracic region: Secondary | ICD-10-CM | POA: Diagnosis not present

## 2022-03-09 DIAGNOSIS — M5412 Radiculopathy, cervical region: Secondary | ICD-10-CM | POA: Diagnosis not present

## 2022-03-09 DIAGNOSIS — M5033 Other cervical disc degeneration, cervicothoracic region: Secondary | ICD-10-CM | POA: Diagnosis not present

## 2022-03-09 DIAGNOSIS — M9901 Segmental and somatic dysfunction of cervical region: Secondary | ICD-10-CM | POA: Diagnosis not present

## 2022-03-09 DIAGNOSIS — M62830 Muscle spasm of back: Secondary | ICD-10-CM | POA: Diagnosis not present

## 2022-03-17 DIAGNOSIS — M5033 Other cervical disc degeneration, cervicothoracic region: Secondary | ICD-10-CM | POA: Diagnosis not present

## 2022-03-17 DIAGNOSIS — M5412 Radiculopathy, cervical region: Secondary | ICD-10-CM | POA: Diagnosis not present

## 2022-03-17 DIAGNOSIS — M9901 Segmental and somatic dysfunction of cervical region: Secondary | ICD-10-CM | POA: Diagnosis not present

## 2022-03-17 DIAGNOSIS — M62830 Muscle spasm of back: Secondary | ICD-10-CM | POA: Diagnosis not present

## 2022-04-07 DIAGNOSIS — M5033 Other cervical disc degeneration, cervicothoracic region: Secondary | ICD-10-CM | POA: Diagnosis not present

## 2022-04-07 DIAGNOSIS — M5412 Radiculopathy, cervical region: Secondary | ICD-10-CM | POA: Diagnosis not present

## 2022-04-07 DIAGNOSIS — M9901 Segmental and somatic dysfunction of cervical region: Secondary | ICD-10-CM | POA: Diagnosis not present

## 2022-04-07 DIAGNOSIS — M62830 Muscle spasm of back: Secondary | ICD-10-CM | POA: Diagnosis not present

## 2022-04-11 DIAGNOSIS — E041 Nontoxic single thyroid nodule: Secondary | ICD-10-CM | POA: Diagnosis not present

## 2022-04-17 DIAGNOSIS — R051 Acute cough: Secondary | ICD-10-CM | POA: Diagnosis not present

## 2022-04-17 DIAGNOSIS — Z03818 Encounter for observation for suspected exposure to other biological agents ruled out: Secondary | ICD-10-CM | POA: Diagnosis not present

## 2022-04-17 DIAGNOSIS — J101 Influenza due to other identified influenza virus with other respiratory manifestations: Secondary | ICD-10-CM | POA: Diagnosis not present

## 2022-04-27 DIAGNOSIS — J111 Influenza due to unidentified influenza virus with other respiratory manifestations: Secondary | ICD-10-CM | POA: Diagnosis not present

## 2022-08-03 IMAGING — MG DIGITAL DIAGNOSTIC BILAT W/ TOMO W/ CAD
9 of 13 series · 9 of 29 positions shown · non-contrast
Comparison: Previous exam(s).

CLINICAL DATA: Short-term follow-up for bilateral breast
calcifications.

EXAM:
DIGITAL DIAGNOSTIC BILATERAL MAMMOGRAM WITH TOMO AND CAD

[R LM]
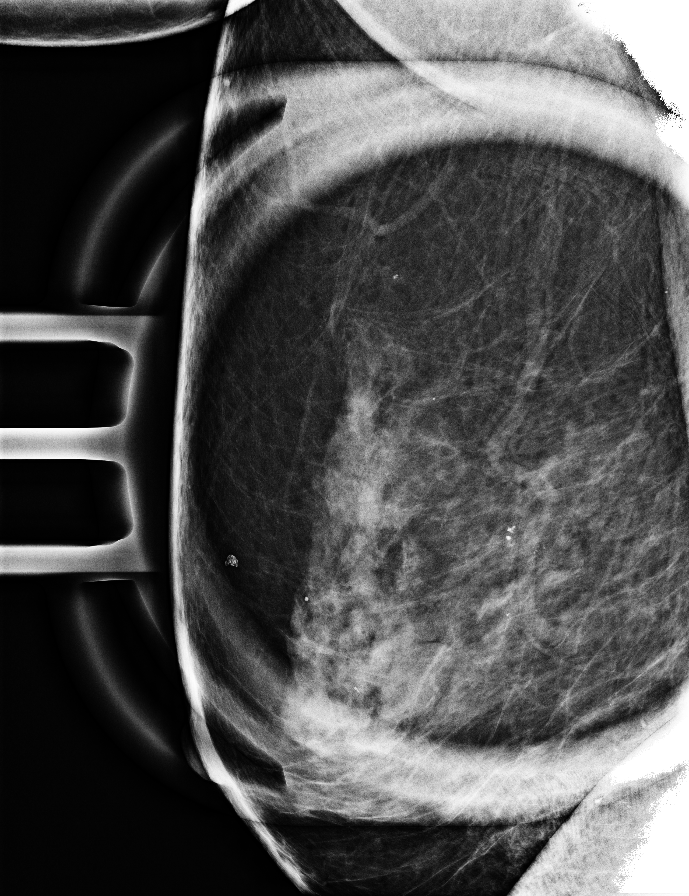

[R CC (1 of 2)]
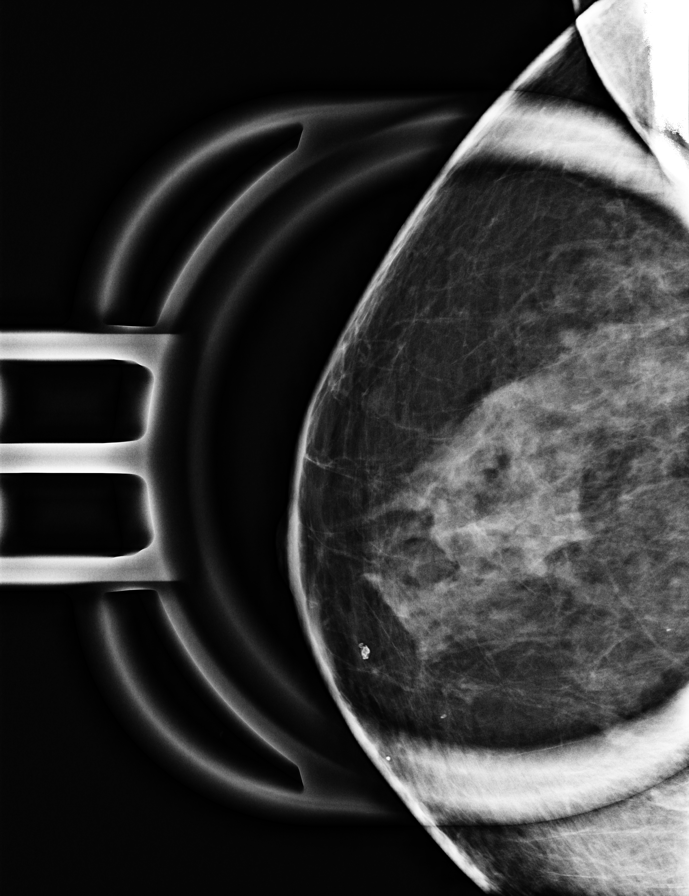

[R CC (2 of 2)]
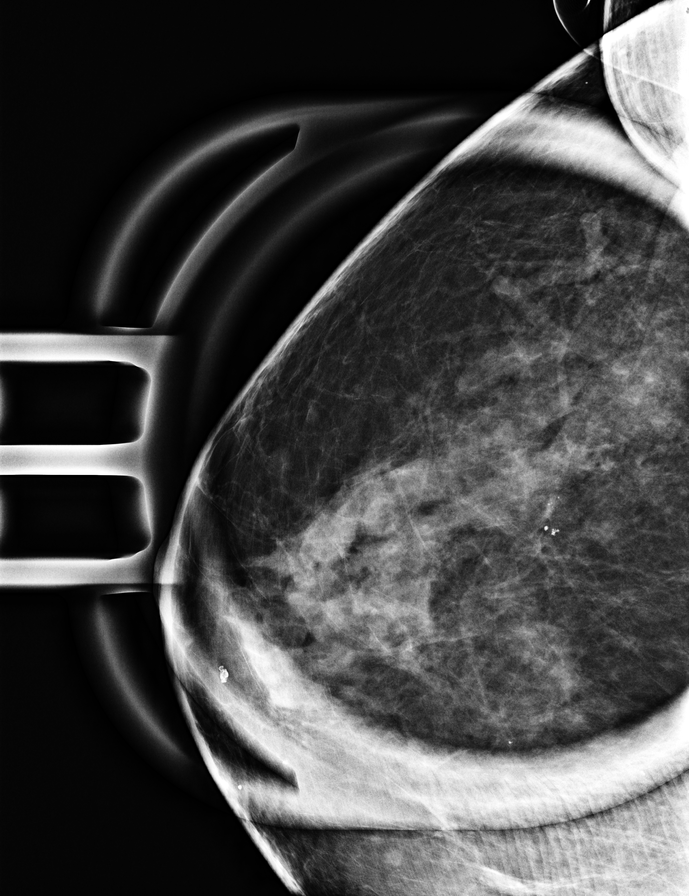

[L CC]
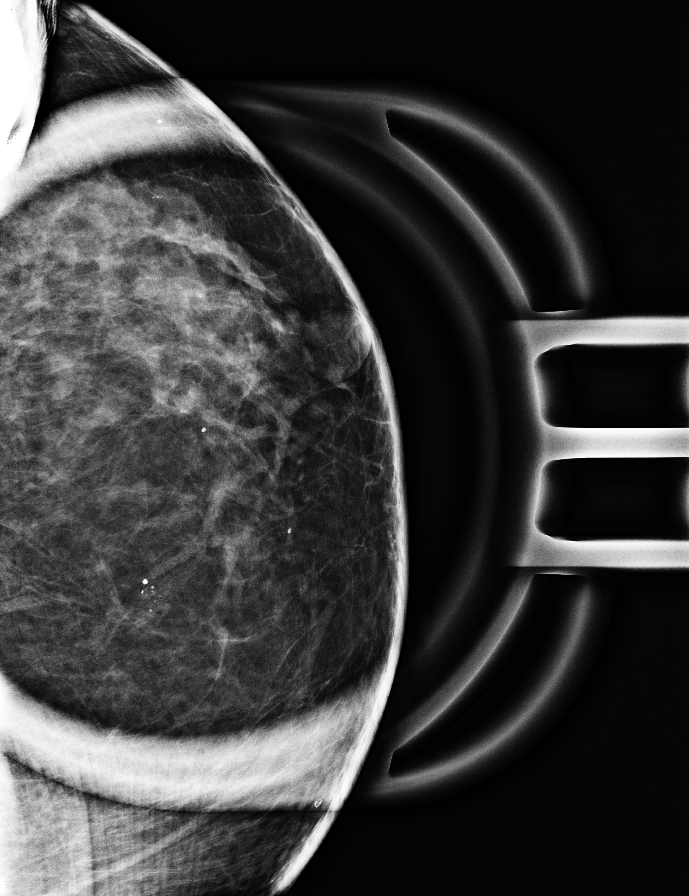

[L LM]
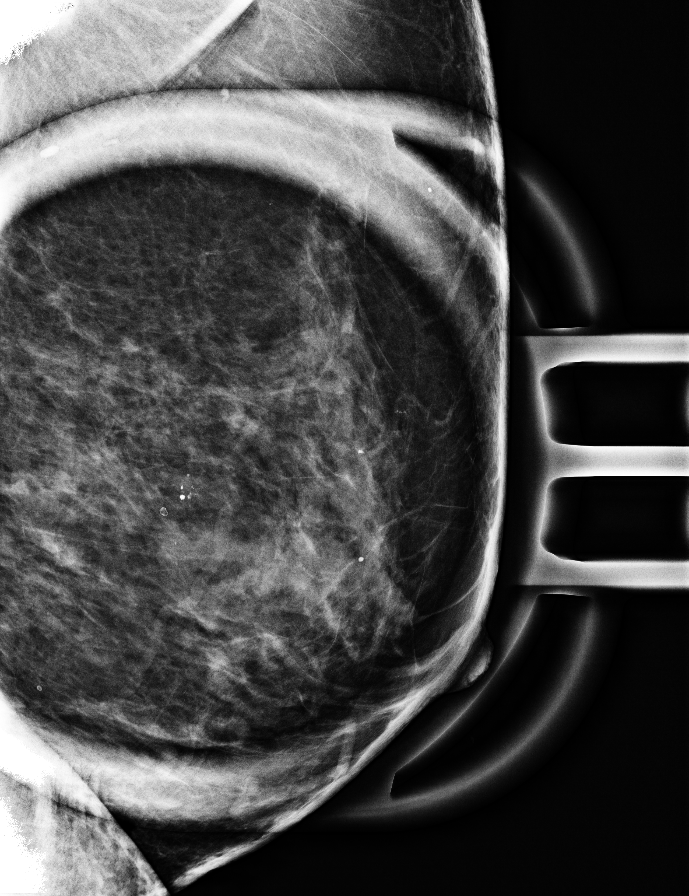

[L MLO synth-2D]
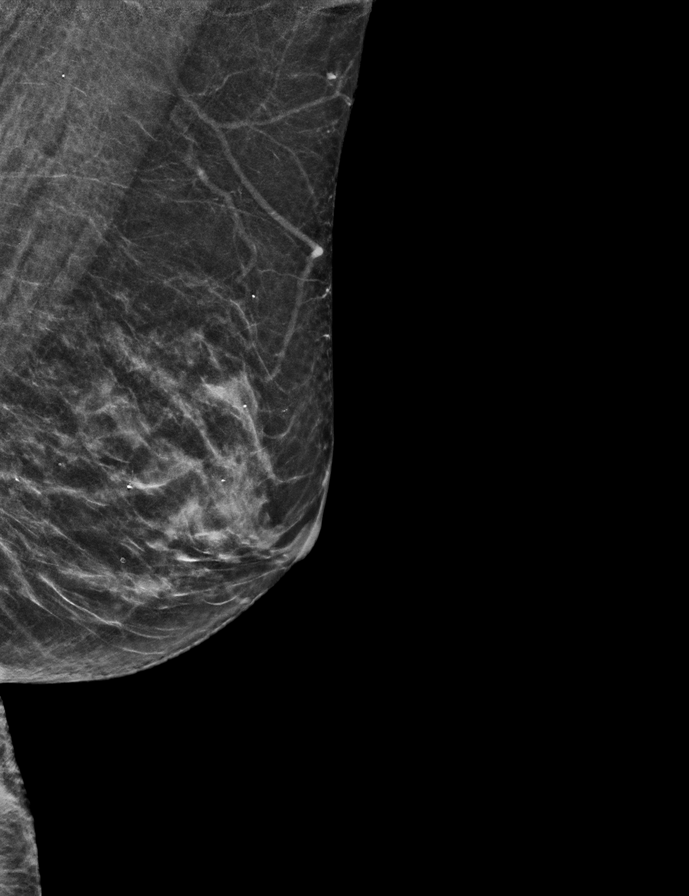

[R MLO synth-2D]
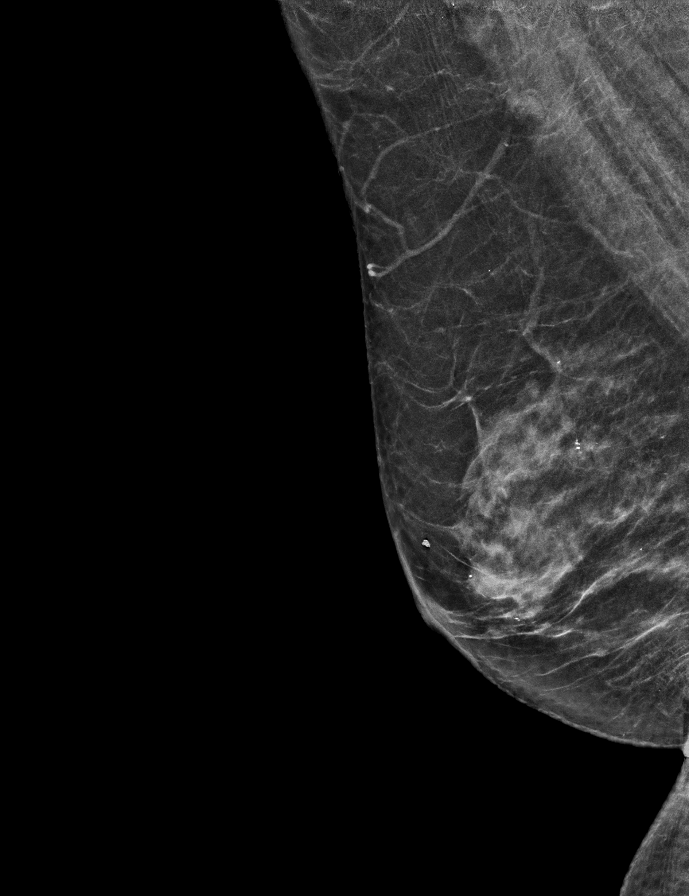

[L CC synth-2D]
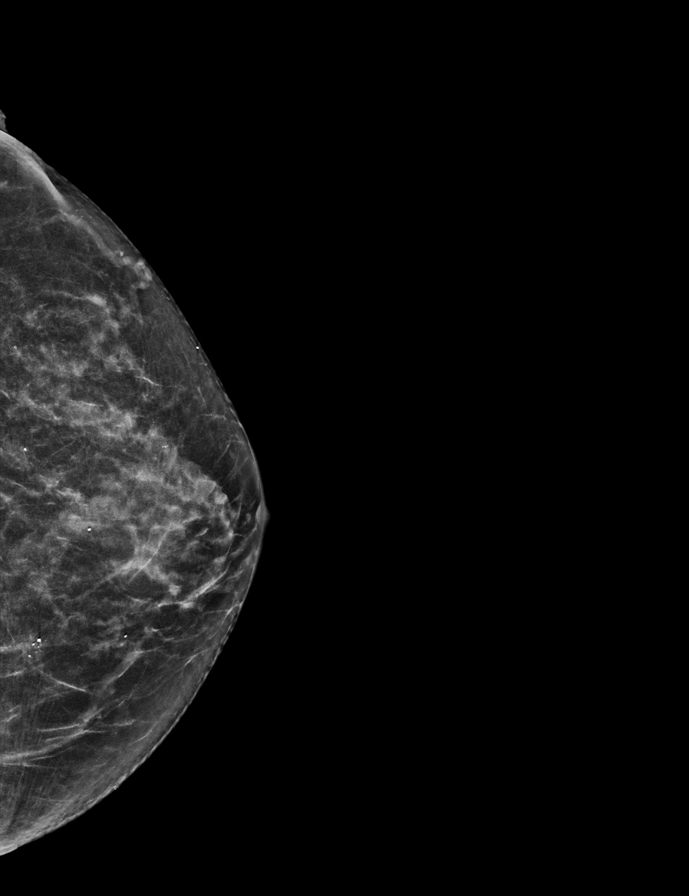

[R CC synth-2D]
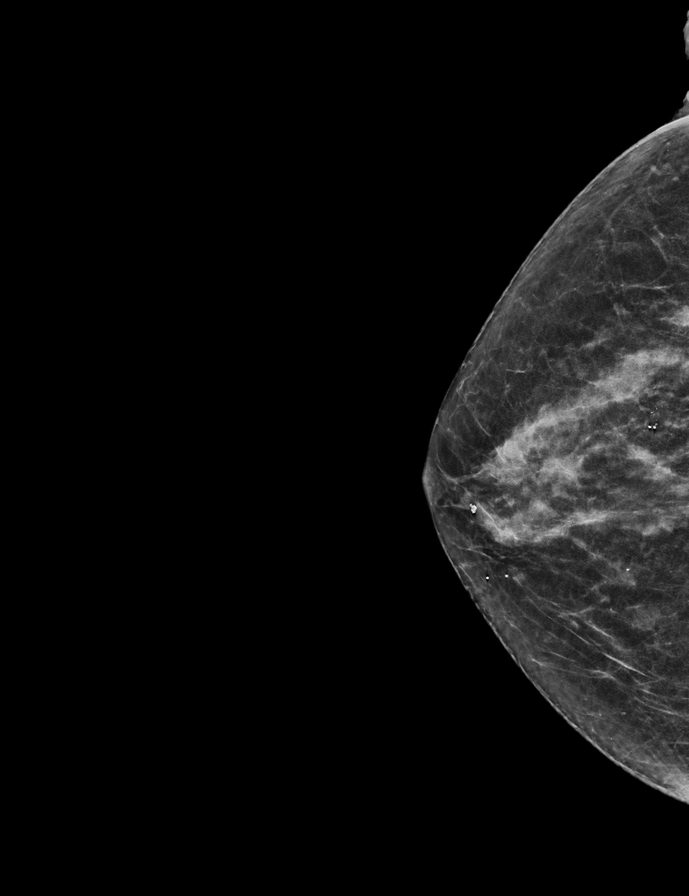

[9 of 29 positions shown; findings below may reference images not displayed]

ACR Breast Density Category c: The breast tissue is heterogeneously
dense, which may obscure small masses.
FINDINGS: The small group of calcifications in the lateral posterior right
breast and medial posterior left breast are mammographically stable.
No suspicious calcifications, masses or areas of distortion are seen
in the bilateral breasts.

Mammographic images were processed with CAD.
IMPRESSION: 1.  Stable bilateral likely benign calcifications.

2.  No evidence of malignancy in the bilateral breasts.

RECOMMENDATION:
Diagnostic mammogram is suggested in 1 year. (Code:L4-R-FNH)

I have discussed the findings and recommendations with the patient.
If applicable, a reminder letter will be sent to the patient
regarding the next appointment.

BI-RADS CATEGORY  3: Probably benign.

## 2023-05-06 ENCOUNTER — Other Ambulatory Visit: Payer: Self-pay | Admitting: Physician Assistant

## 2023-05-06 DIAGNOSIS — K219 Gastro-esophageal reflux disease without esophagitis: Secondary | ICD-10-CM

## 2023-08-14 ENCOUNTER — Other Ambulatory Visit: Payer: Self-pay | Admitting: Internal Medicine

## 2023-08-14 DIAGNOSIS — Z1231 Encounter for screening mammogram for malignant neoplasm of breast: Secondary | ICD-10-CM

## 2023-08-21 ENCOUNTER — Ambulatory Visit
Admission: RE | Admit: 2023-08-21 | Discharge: 2023-08-21 | Disposition: A | Discharge status: Home / Self Care | Source: Ambulatory Visit | Attending: Internal Medicine | Admitting: Internal Medicine

## 2023-08-21 DIAGNOSIS — Z1231 Encounter for screening mammogram for malignant neoplasm of breast: Secondary | ICD-10-CM | POA: Insufficient documentation
